# Patient Record
Sex: Female | Born: 1945 | Race: White | Hispanic: No | Smoking: Former smoker
Health system: Southern US, Community
[De-identification: ages and names within clinical notes are randomized; demographics above are authoritative.]

## PROBLEM LIST (undated history)

## (undated) DIAGNOSIS — D709 Neutropenia, unspecified: Secondary | ICD-10-CM

## (undated) DIAGNOSIS — M1990 Unspecified osteoarthritis, unspecified site: Secondary | ICD-10-CM

## (undated) DIAGNOSIS — E785 Hyperlipidemia, unspecified: Secondary | ICD-10-CM

## (undated) DIAGNOSIS — I10 Essential (primary) hypertension: Secondary | ICD-10-CM

## (undated) DIAGNOSIS — J449 Chronic obstructive pulmonary disease, unspecified: Secondary | ICD-10-CM

## (undated) DIAGNOSIS — C801 Malignant (primary) neoplasm, unspecified: Secondary | ICD-10-CM

## (undated) DIAGNOSIS — IMO0002 Reserved for concepts with insufficient information to code with codable children: Secondary | ICD-10-CM

## (undated) DIAGNOSIS — Z923 Personal history of irradiation: Secondary | ICD-10-CM

## (undated) DIAGNOSIS — M069 Rheumatoid arthritis, unspecified: Secondary | ICD-10-CM

## (undated) DIAGNOSIS — IMO0001 Reserved for inherently not codable concepts without codable children: Secondary | ICD-10-CM

## (undated) DIAGNOSIS — C4490 Unspecified malignant neoplasm of skin, unspecified: Secondary | ICD-10-CM

## (undated) DIAGNOSIS — I2510 Atherosclerotic heart disease of native coronary artery without angina pectoris: Secondary | ICD-10-CM

## (undated) DIAGNOSIS — Z720 Tobacco use: Secondary | ICD-10-CM

## (undated) HISTORY — DX: Malignant (primary) neoplasm, unspecified: C80.1

## (undated) HISTORY — DX: Personal history of irradiation: Z92.3

## (undated) HISTORY — DX: Atherosclerotic heart disease of native coronary artery without angina pectoris: I25.10

## (undated) HISTORY — DX: Tobacco use: Z72.0

## (undated) HISTORY — PX: SKIN CANCER EXCISION: SHX779

## (undated) HISTORY — DX: Hyperlipidemia, unspecified: E78.5

## (undated) HISTORY — PX: CATARACT EXTRACTION: SUR2

## (undated) HISTORY — DX: Neutropenia, unspecified: D70.9

## (undated) HISTORY — DX: Rheumatoid arthritis, unspecified: M06.9

## (undated) HISTORY — DX: Unspecified osteoarthritis, unspecified site: M19.90

## (undated) HISTORY — DX: Chronic obstructive pulmonary disease, unspecified: J44.9

## (undated) HISTORY — DX: Essential (primary) hypertension: I10

## (undated) HISTORY — PX: CARDIAC CATHETERIZATION: SHX172

---

## 2011-12-14 ENCOUNTER — Emergency Department (HOSPITAL_COMMUNITY)
Admission: EM | Admit: 2011-12-14 | Discharge: 2011-12-14 | Disposition: A | Discharge status: Home / Self Care | Payer: PRIVATE HEALTH INSURANCE | Attending: Emergency Medicine | Admitting: Emergency Medicine

## 2011-12-14 ENCOUNTER — Emergency Department (HOSPITAL_COMMUNITY): Payer: PRIVATE HEALTH INSURANCE

## 2011-12-14 ENCOUNTER — Encounter (HOSPITAL_COMMUNITY): Payer: Self-pay | Admitting: Emergency Medicine

## 2011-12-14 ENCOUNTER — Other Ambulatory Visit: Payer: Self-pay

## 2011-12-14 DIAGNOSIS — D72819 Decreased white blood cell count, unspecified: Secondary | ICD-10-CM

## 2011-12-14 DIAGNOSIS — R42 Dizziness and giddiness: Secondary | ICD-10-CM | POA: Insufficient documentation

## 2011-12-14 DIAGNOSIS — E7800 Pure hypercholesterolemia, unspecified: Secondary | ICD-10-CM | POA: Insufficient documentation

## 2011-12-14 DIAGNOSIS — F17200 Nicotine dependence, unspecified, uncomplicated: Secondary | ICD-10-CM | POA: Insufficient documentation

## 2011-12-14 DIAGNOSIS — I10 Essential (primary) hypertension: Secondary | ICD-10-CM | POA: Insufficient documentation

## 2011-12-14 DIAGNOSIS — Z79899 Other long term (current) drug therapy: Secondary | ICD-10-CM | POA: Insufficient documentation

## 2011-12-14 DIAGNOSIS — Z7982 Long term (current) use of aspirin: Secondary | ICD-10-CM | POA: Insufficient documentation

## 2011-12-14 LAB — CBC WITH DIFFERENTIAL/PLATELET
Basophils Absolute: 0 10*3/uL (ref 0.0–0.1)
Eosinophils Absolute: 0 10*3/uL (ref 0.0–0.7)
Lymphs Abs: 0.9 10*3/uL (ref 0.7–4.0)
MCHC: 33.4 g/dL (ref 30.0–36.0)
MCV: 86.1 fL (ref 78.0–100.0)
Monocytes Relative: 20 % — ABNORMAL HIGH (ref 3–12)
Platelets: 160 10*3/uL (ref 150–400)
RDW: 14.1 % (ref 11.5–15.5)
WBC: 1.2 10*3/uL — CL (ref 4.0–10.5)

## 2011-12-14 LAB — COMPREHENSIVE METABOLIC PANEL
AST: 18 U/L (ref 0–37)
Albumin: 3.6 g/dL (ref 3.5–5.2)
BUN: 13 mg/dL (ref 6–23)
Calcium: 8.9 mg/dL (ref 8.4–10.5)
Chloride: 102 mEq/L (ref 96–112)
Creatinine, Ser: 0.73 mg/dL (ref 0.50–1.10)
Total Bilirubin: 0.3 mg/dL (ref 0.3–1.2)
Total Protein: 7.7 g/dL (ref 6.0–8.3)

## 2011-12-14 LAB — URINALYSIS, ROUTINE W REFLEX MICROSCOPIC
Hgb urine dipstick: NEGATIVE
Ketones, ur: NEGATIVE mg/dL
Leukocytes, UA: NEGATIVE
Protein, ur: NEGATIVE mg/dL
Urobilinogen, UA: 1 mg/dL (ref 0.0–1.0)

## 2011-12-14 MED ORDER — ALBUTEROL SULFATE HFA 108 (90 BASE) MCG/ACT IN AERS: 2.0000 | INHALATION_SPRAY | RESPIRATORY_TRACT | Status: DC | PRN

## 2011-12-14 NOTE — ED Notes (Signed)
I gave the patient a warm blanket. 

## 2011-12-14 NOTE — ED Notes (Signed)
I gave the patients visitor a cup of coffee.

## 2011-12-14 NOTE — ED Notes (Signed)
Lab called with critical Low WBC = 1.2. Preston Fleeting MD notified.

## 2011-12-14 NOTE — ED Notes (Addendum)
ECG was performed in triage.

## 2011-12-14 NOTE — ED Notes (Signed)
Pt c/o dizziness x several months that became worse yesterday; pt sts worse with position change; pt sts HA yesterday; pt alert at present; pt denies weakness

## 2011-12-14 NOTE — ED Provider Notes (Signed)
History     CSN: [id number]  Arrival date & time 12/14/11  1506   First MD Initiated Contact with Patient 12/14/11 1635      Chief Complaint  Patient presents with  . Dizziness    (Consider location/radiation/quality/duration/timing/severity/associated sxs/prior treatment) The history is provided by the patient.   66 year old female has been having dizzy episodes for the last several weeks. They are stable neither getting worse and her better. She gets dizzy when she stands up but is fine when she is sitting down or laying down. She feels lightheaded and like she is going to pass out and off balance. She denies vertigo. She has not actually fallen. She has been seen by her PCP and in the emergency department at her local hospital and was found to have low blood count and was advised that she should come here. She has had fevers as high as 102, and has had some sweats. She denies chills. She denies cough, arthralgias, myalgias. Last night she vomited but she does not have any nausea now. Her appetite has been diminished but she does nothing she has lost any weight.  Past Medical History  Diagnosis Date  . Hypertension   . Hypercholesteremia     History reviewed. No pertinent past surgical history.  History reviewed. No pertinent family history.  History  Substance Use Topics  . Smoking status: Current Everyday Smoker  . Smokeless tobacco: Not on file  . Alcohol Use: No    OB History    Grav Para Term Preterm Abortions TAB SAB Ect Mult Living                  Review of Systems  All other systems reviewed and are negative.    Allergies  Codeine and Morphine and related  Home Medications   Current Outpatient Rx  Name Route Sig Dispense Refill  . ASPIRIN 325 MG PO TABS Oral Take 325-650 mg by mouth daily as needed. For pain    . CLONAZEPAM 1 MG PO TABS Oral Take 1 mg by mouth at bedtime.    [name] LISINOPRIL 20 MG PO TABS Oral Take 20 mg by mouth daily.    [name] PRAVASTATIN  SODIUM 40 MG PO TABS Oral Take 40 mg by mouth at bedtime.      BP 96/47  Pulse 68  Temp 98.1 F (36.7 C) (Oral)  Resp 16  SpO2 99%  Physical Exam  Nursing note and vitals reviewed.  66 year old female who is resting comfortably and in no acute distress. Vital signs are normal. Oxygen saturation is 99% which is normal. Head is normocephalic and atraumatic. PERRLA, EOMI. Neck is nontender and supple without adenopathy, JVD, or bruit. There is a transmitted murmur heard in the carotids. Back is nontender. Lungs are clear without rales, wheezes, or rhonchi. Heart has regular rate and rhythm with a 1-2/6 systolic ejection murmur heard along the left sternal border and cardiac base. Abdomen is soft, flat, nontender without masses or hepatosplenomegaly. Extremities have no cyanosis or edema, full range of motion is present. Skin is warm and dry without rash. Neurologic: Mental status is normal, cranial nerves are intact, there are no motor or sensory deficits. On Romberg testing, she is somewhat unsteady but does not consistently fall in any one direction.  ED Course  Procedures (including critical care time)  Results for orders placed during the hospital encounter of 12/14/11  URINALYSIS, ROUTINE W REFLEX MICROSCOPIC      Component Value Range  Color, Urine YELLOW  YELLOW   APPearance CLEAR  CLEAR   Specific Gravity, Urine 1.009  1.005 - 1.030   pH 6.0  5.0 - 8.0   Glucose, UA NEGATIVE  NEGATIVE mg/dL   Hgb urine dipstick NEGATIVE  NEGATIVE   Bilirubin Urine NEGATIVE  NEGATIVE   Ketones, ur NEGATIVE  NEGATIVE mg/dL   Protein, ur NEGATIVE  NEGATIVE mg/dL   Urobilinogen, UA 1.0  0.0 - 1.0 mg/dL   Nitrite NEGATIVE  NEGATIVE   Leukocytes, UA NEGATIVE  NEGATIVE  CBC WITH DIFFERENTIAL      Component Value Range   WBC 1.2 (*) 4.0 - 10.5 K/uL   RBC 3.75 (*) 3.87 - 5.11 MIL/uL   Hemoglobin 10.8 (*) 12.0 - 15.0 g/dL   HCT 16.1 (*) 09.6 - 04.5 %   MCV 86.1  78.0 - 100.0 fL   MCH 28.8  26.0 -  34.0 pg   MCHC 33.4  30.0 - 36.0 g/dL   RDW 40.9  81.1 - 91.4 %   Platelets 160  150 - 400 K/uL   Neutrophils Relative 6 (*) 43 - 77 %   Lymphocytes Relative 74 (*) 12 - 46 %   Monocytes Relative 20 (*) 3 - 12 %   Eosinophils Relative 0  0 - 5 %   Basophils Relative 0  0 - 1 %   Neutro Abs 0.1 (*) 1.7 - 7.7 K/uL   Lymphs Abs 0.9  0.7 - 4.0 K/uL   Monocytes Absolute 0.2  0.1 - 1.0 K/uL   Eosinophils Absolute 0.0  0.0 - 0.7 K/uL   Basophils Absolute 0.0  0.0 - 0.1 K/uL   Smear Review MORPHOLOGY UNREMARKABLE    COMPREHENSIVE METABOLIC PANEL      Component Value Range   Sodium 135  135 - 145 mEq/L   Potassium 4.3  3.5 - 5.1 mEq/L   Chloride 102  96 - 112 mEq/L   CO2 24  19 - 32 mEq/L   Glucose, Bld 106 (*) 70 - 99 mg/dL   BUN 13  6 - 23 mg/dL   Creatinine, Ser 7.82  0.50 - 1.10 mg/dL   Calcium 8.9  8.4 - 95.6 mg/dL   Total Protein 7.7  6.0 - 8.3 g/dL   Albumin 3.6  3.5 - 5.2 g/dL   AST 18  0 - 37 U/L   ALT 7  0 - 35 U/L   Alkaline Phosphatase 70  39 - 117 U/L   Total Bilirubin 0.3  0.3 - 1.2 mg/dL   GFR calc non Af Amer 86 (*) >90 mL/min   GFR calc Af Amer >90  >90 mL/min   Dg Chest 2 View  12/14/2011  *RADIOLOGY REPORT*  Clinical Data: Dizziness and weakness  CHEST - 2 VIEW  Comparison: None.  Findings: There are two nodules in the inferior right upper lobe. Left apical nodular densities are noted.  Interstitial prominence. Hyperaeration.  Bronchitic changes.  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  IMPRESSION: Bilateral pulmonary nodules.  CT can be performed if no prior studies are available.  Changes related to COPD.  Interstitial prominence.  This may reflect chronic disease however interstitial edema is not excluded.  Prior studies would be helpful for comparison.  Original Report Authenticated By: Donavan Burnet, M.D.      Date: 12/14/2011  Rate: 72  Rhythm: normal sinus rhythm  QRS Axis: right  Intervals: normal  ST/T Wave abnormalities: normal  Conduction  Disutrbances:none  Narrative Interpretation: Borderline right axis deviation, otherwise normal ECG. No old ECG available for comparison.  Old EKG Reviewed: none available    1. Leukopenia       MDM  Orthostatic dizziness of uncertain cause. Patient came with office notes and ED notes which showed that her WBC was 1.8 with hemoglobin of 11 and normal platelet count. Apparently, and WBC has been as low as 0.7. She had an adequate absolute neutrophil count. Because of leukopenia is unclear. With normal platelet count and borderline significant anemia, I doubt that this is actually an acute leukemia. Leukopenia is a known but relatively rare side effect of clonazepam, lisinopril, and pravastatin which are medications which the patient is taking. She will need a bone marrow aspirate and biopsy for more definitive diagnosis.  Case is discussed with Dr. Welton Flakes of the oncology service. She is not febrile today although absolute neutrophil count is extremely low being at about 65. She will need to be admitted if she does become febrile, but otherwise workup can be done as an outpatient. She's advised to discontinue all of her current medications since all of them have at least case reports of causing leukopenia. Dr. Welton Flakes will set up an appointment in the oncology clinic for outpatient evaluation.     Dione Booze, MD 12/14/11 [phone]

## 2011-12-14 NOTE — ED Notes (Signed)
Updated pt on wait time and care. Pt resting with family member at side. Told to let RN know if anything changes

## 2011-12-15 LAB — PATHOLOGIST SMEAR REVIEW

## 2011-12-20 ENCOUNTER — Telehealth: Payer: Self-pay | Admitting: Oncology

## 2011-12-20 NOTE — Telephone Encounter (Signed)
S/w the pt's caregiver and they have declined the appt her ewith [place] and has na appt set up wityh an md in [place] which is closer to their home

## 2011-12-27 ENCOUNTER — Other Ambulatory Visit (HOSPITAL_COMMUNITY)
Admission: RE | Admit: 2011-12-27 | Discharge: 2011-12-27 | Disposition: A | Discharge status: Home / Self Care | Payer: PRIVATE HEALTH INSURANCE | Source: Ambulatory Visit | Attending: Oncology | Admitting: Oncology

## 2011-12-27 DIAGNOSIS — D709 Neutropenia, unspecified: Secondary | ICD-10-CM | POA: Insufficient documentation

## 2011-12-27 DIAGNOSIS — D649 Anemia, unspecified: Secondary | ICD-10-CM | POA: Insufficient documentation

## 2012-01-05 ENCOUNTER — Ambulatory Visit: Payer: Medicare Other | Admitting: Oncology

## 2012-01-05 ENCOUNTER — Other Ambulatory Visit: Payer: Medicare Other | Admitting: Lab

## 2012-01-05 ENCOUNTER — Ambulatory Visit: Payer: Medicare Other

## 2012-09-12 DIAGNOSIS — C801 Malignant (primary) neoplasm, unspecified: Secondary | ICD-10-CM

## 2012-09-12 HISTORY — PX: LUNG BIOPSY: SHX232

## 2012-09-12 HISTORY — DX: Malignant (primary) neoplasm, unspecified: C80.1

## 2012-12-24 ENCOUNTER — Encounter: Payer: Medicare Other | Admitting: Cardiothoracic Surgery

## 2012-12-25 ENCOUNTER — Encounter: Payer: Self-pay | Admitting: *Deleted

## 2012-12-25 ENCOUNTER — Other Ambulatory Visit: Payer: Self-pay | Admitting: *Deleted

## 2012-12-25 DIAGNOSIS — D709 Neutropenia, unspecified: Secondary | ICD-10-CM | POA: Insufficient documentation

## 2012-12-25 DIAGNOSIS — C3490 Malignant neoplasm of unspecified part of unspecified bronchus or lung: Secondary | ICD-10-CM | POA: Insufficient documentation

## 2012-12-26 ENCOUNTER — Institutional Professional Consult (permissible substitution) (INDEPENDENT_AMBULATORY_CARE_PROVIDER_SITE_OTHER): Payer: PRIVATE HEALTH INSURANCE | Admitting: Cardiothoracic Surgery

## 2012-12-26 ENCOUNTER — Encounter: Payer: Medicare Other | Admitting: Cardiothoracic Surgery

## 2012-12-26 ENCOUNTER — Other Ambulatory Visit: Payer: Self-pay | Admitting: *Deleted

## 2012-12-26 VITALS — BP 103/63 | HR 81 | Resp 16 | Ht 68.0 in | Wt 135.0 lb

## 2012-12-26 DIAGNOSIS — M069 Rheumatoid arthritis, unspecified: Secondary | ICD-10-CM | POA: Insufficient documentation

## 2012-12-26 DIAGNOSIS — R42 Dizziness and giddiness: Secondary | ICD-10-CM

## 2012-12-26 DIAGNOSIS — J984 Other disorders of lung: Secondary | ICD-10-CM

## 2012-12-26 DIAGNOSIS — R911 Solitary pulmonary nodule: Secondary | ICD-10-CM

## 2012-12-26 DIAGNOSIS — F17200 Nicotine dependence, unspecified, uncomplicated: Secondary | ICD-10-CM

## 2012-12-26 DIAGNOSIS — B029 Zoster without complications: Secondary | ICD-10-CM | POA: Insufficient documentation

## 2012-12-26 DIAGNOSIS — J449 Chronic obstructive pulmonary disease, unspecified: Secondary | ICD-10-CM | POA: Insufficient documentation

## 2012-12-26 DIAGNOSIS — C3490 Malignant neoplasm of unspecified part of unspecified bronchus or lung: Secondary | ICD-10-CM

## 2012-12-26 DIAGNOSIS — Z720 Tobacco use: Secondary | ICD-10-CM | POA: Insufficient documentation

## 2012-12-26 LAB — CREATININE, SERUM: Creat: 1.01 mg/dL (ref 0.50–1.10)

## 2012-12-26 LAB — BUN: BUN: 11 mg/dL (ref 6–23)

## 2012-12-26 NOTE — Progress Notes (Signed)
PCP is Ailene Ravel, MD Referring Provider is Weston Settle, MD  Chief Complaint  Patient presents with  . 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 She was initially evaluated by Dr. Lissa Hoard for a 2 cm right lower lobe mass and a 1 cm right upper lobe mass. He's had fairly low activity and PET scan in repeat scans this spring showed increase in activity of the right  lower lobe mass at 9.6 SUV. The right upper lobe mass had a SUV of 4.3 slightly less than the initial scan. Patient underwent a needle biopsy of the right lower lobe mass which showed squamous cell carcinoma.. 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  PET scan showed no suspicious mediastinal or other metastatic sites. She has not had a head scan.  The patient still smokes at least half a pack a day and has active productive cough. PFTs show FEV1 of 1.8 and FVC 2.5 with diffusion capacity of 44%. A 3 minute walk test in the office dropped oxygen saturation from 97% at rest to 95% after exercise.  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 Past Medical History  Diagnosis Date  . Hypertension   . Hypercholesteremia   . Cancer 09/12/12    RIGHT LOWER LOBE    Past Surgical History  Procedure Laterality Date  . Lung biopsy  09/12/12    [facility]    No family history on file.  Social History History  Substance Use Topics  . Smoking status: Current Every Day Smoker  . Smokeless tobacco: Not on file  . Alcohol Use: No    Current  Outpatient Prescriptions  Medication Sig Dispense Refill  . diphenhydrAMINE (SOMINEX) 25 MG tablet Take 50 mg by mouth at bedtime as needed for sleep.      [name] losartan (COZAAR) 50 MG tablet Take 50 mg by mouth daily.      . pravastatin (PRAVACHOL) 40 MG tablet Take 40 mg by mouth at bedtime.      [name] albuterol (PROVENTIL HFA;VENTOLIN HFA) 108 (90 BASE) MCG/ACT inhaler Inhale 2 puffs into the lungs every 4 (four) hours as needed for wheezing or shortness of breath (or coughing).  1 Inhaler  0  . aspirin 325 MG tablet Take 325-650 mg by mouth daily as needed. For pain      . [DISCONTINUED] clonazePAM (KLONOPIN) 1 MG tablet Take 1 mg by mouth at bedtime.      . [DISCONTINUED] lisinopril (PRINIVIL,ZESTRIL) 20 MG tablet Take 20 mg by mouth daily.       No current facility-administered medications for this visit.    Allergies  Allergen Reactions  . Codeine Other (See Comments)    Passes out  . Morphine And Related Other (See Comments)    Patient assumes she is allergic to morphine because she is allergic to codeine    Review of Systems Gen.-Weight stable actually increased after prednisone taper no night sweats no headaches no bone pain except for arthritic knees HEENT-edentulous some difficulty swallowing solid> liquid with coughing Thorax small pneumothorax after needle biopsy of right lung  no history of significant thoracic trauma otherwise Cardiac no history of angina no family history of MI positive risk factors of hypertension smoking and hyperlipidemia    No previous cardiac evaluation GI no ulcer jaundice hepatitis colitis Urologic no history of kidney stones or hematuria Endocrine no history diabetes                                             vascular no DVT claudication or TIA Neurologic no falls no strokes head CT scan pending Hematologic chronic low white count runs between 1.3k  and normal range  BP 103/63  Pulse 81  Resp 16  Ht 5\' 8"  (1.727 m)  Wt 135 lb (61.236 kg)  BMI  20.53 kg/m2  SpO2 97% Physical Exam Gen. fragile elderly female with severe ecchymotic areas over her forearms appears older than stated age HEENT normocephalic edentulous pupils equal Neck without mass JVD adenopathy Thorax scattered rhonchi and wheezes Cardiac regular and without murmur or gallop Abdomen scaphoid soft nontender without pulsatile mass Extremities mild clubbing mild deformities from arthritis of the hands no significant edema Vascular nonpalpable pedal pulses extremities however warm no DVT signs   Diagnostic Tests:  CT scan, PET scan, PFTs and pathology report all reviewed  Impression:  2 probable carcinomas in the right lung--right upper lobe not biopsy-proven and right lower lobe biopsy proven squamous cell carcinoma. 2 concurrent malignancies in the ipsilateral lung indicate the is stage III. However assessment of the anatomy indicates these both could be wedged out and would probably divide her best long-term option if she can stop smoking. She understands she'll not be eligible for surgery unless she completely stop smoking for 3   She will also need to have her severe herpes zoster with surrounding cellulitis resolved prior to surgery. She apparently has been treated with this with oral acyclovir and topical soap.  Plan:  Return for reassessment in 3 weeks with a head CT scan to complete clinical staging and to assess status of her smoking and status of the herpes zoster with surrounding cellulitis. Surgery will be scheduled for later date when her smoking and herpes zoster are. resolved

## 2012-12-30 ENCOUNTER — Other Ambulatory Visit: Payer: Self-pay | Admitting: *Deleted

## 2012-12-30 DIAGNOSIS — R911 Solitary pulmonary nodule: Secondary | ICD-10-CM

## 2013-01-01 ENCOUNTER — Other Ambulatory Visit: Payer: Self-pay | Admitting: *Deleted

## 2013-01-06 ENCOUNTER — Other Ambulatory Visit: Payer: Self-pay | Admitting: *Deleted

## 2013-01-06 DIAGNOSIS — R519 Headache, unspecified: Secondary | ICD-10-CM

## 2013-01-06 DIAGNOSIS — R911 Solitary pulmonary nodule: Secondary | ICD-10-CM

## 2013-01-07 ENCOUNTER — Other Ambulatory Visit: Payer: Self-pay | Admitting: *Deleted

## 2013-01-07 ENCOUNTER — Other Ambulatory Visit: Payer: PRIVATE HEALTH INSURANCE

## 2013-01-07 DIAGNOSIS — R911 Solitary pulmonary nodule: Secondary | ICD-10-CM

## 2013-01-08 ENCOUNTER — Other Ambulatory Visit: Payer: PRIVATE HEALTH INSURANCE

## 2013-01-08 ENCOUNTER — Ambulatory Visit: Payer: PRIVATE HEALTH INSURANCE | Admitting: Cardiothoracic Surgery

## 2013-01-22 ENCOUNTER — Encounter: Payer: Self-pay | Admitting: Cardiothoracic Surgery

## 2013-01-22 ENCOUNTER — Other Ambulatory Visit: Payer: PRIVATE HEALTH INSURANCE

## 2013-01-22 ENCOUNTER — Ambulatory Visit: Payer: PRIVATE HEALTH INSURANCE | Admitting: Cardiothoracic Surgery

## 2013-01-22 ENCOUNTER — Ambulatory Visit (INDEPENDENT_AMBULATORY_CARE_PROVIDER_SITE_OTHER): Payer: PRIVATE HEALTH INSURANCE | Admitting: Cardiothoracic Surgery

## 2013-01-22 VITALS — BP 166/88 | HR 86 | Resp 18 | Ht 68.0 in | Wt 131.0 lb

## 2013-01-22 DIAGNOSIS — C3490 Malignant neoplasm of unspecified part of unspecified bronchus or lung: Secondary | ICD-10-CM

## 2013-01-22 DIAGNOSIS — F17200 Nicotine dependence, unspecified, uncomplicated: Secondary | ICD-10-CM

## 2013-01-22 DIAGNOSIS — B029 Zoster without complications: Secondary | ICD-10-CM

## 2013-01-22 DIAGNOSIS — C3491 Malignant neoplasm of unspecified part of right bronchus or lung: Secondary | ICD-10-CM

## 2013-01-22 DIAGNOSIS — Z720 Tobacco use: Secondary | ICD-10-CM

## 2013-01-22 DIAGNOSIS — J984 Other disorders of lung: Secondary | ICD-10-CM

## 2013-01-22 DIAGNOSIS — R911 Solitary pulmonary nodule: Secondary | ICD-10-CM

## 2013-01-22 NOTE — Progress Notes (Signed)
PCP is Ailene Ravel, MD Referring Provider is Weston Settle, MD  Chief Complaint  Patient presents with  . Lung Lesion    3 week f/u S/P MRI Brain, last cigarette on 01/20/13    HPI: 67 year old female smoker with biopsy-proven squamous cell carcinoma basilar segment right lower lobe. She also has a small 1.8 cm right upper lobe density with mild PET activity. This has not been biopsied. No evidence of mediastinal or distant metastases by PET scan and brain MRI. She is still smoking but says she stopped yesterday.  Her herpes zoster has resolved She has significant coronary calcifications on her CT scan of chest from March 2014, history of hypertension smoking and positive family history of CAD. She'll need stress test-cardiology clearance prior to surgery--surgery will be offered only if she stops smoking for 3 weeks.   Past Medical History  Diagnosis Date  . Hypertension   . Hypercholesteremia   . Cancer 09/12/12    RIGHT LOWER LOBE    Past Surgical History  Procedure Laterality Date  . Lung biopsy  09/12/12    [facility]    No family history on file.  Social History History  Substance Use Topics  . Smoking status: Former Smoker    Start date: 01/20/2013  . Smokeless tobacco: Not on file  . Alcohol Use: No    Current Outpatient Prescriptions  Medication Sig Dispense Refill  . albuterol (PROVENTIL HFA;VENTOLIN HFA) 108 (90 BASE) MCG/ACT inhaler Inhale 2 puffs into the lungs every 4 (four) hours as needed for wheezing or shortness of breath (or coughing).  1 Inhaler  0  . aspirin 325 MG tablet Take 325-650 mg by mouth daily as needed. For pain      . clonazePAM (KLONOPIN) 1 MG tablet Take 1 mg by mouth 2 (two) times daily as needed for anxiety.      [name] losartan (COZAAR) 50 MG tablet Take 50 mg by mouth daily.      . pravastatin (PRAVACHOL) 40 MG tablet Take 40 mg by mouth at bedtime.      . [DISCONTINUED] lisinopril (PRINIVIL,ZESTRIL) 20 MG tablet Take 20 mg  by mouth daily.       No current facility-administered medications for this visit.    Allergies  Allergen Reactions  . Codeine Other (See Comments)    Passes out  . Morphine And Related Other (See Comments)    Patient assumes she is allergic to morphine because she is allergic to codeine    Review of Systems no weight change no hemoptysis Patient stays fairly active according to her daughter BP 166/88  Pulse 86  Resp 18  Ht 5\' 8"  (1.727 m)  Wt 131 lb (59.421 kg)  BMI 19.92 kg/m2  SpO2 97% Physical Exam Alert and responsive, able to walk easily down the hallway in the office Lungs scattered rhonchi Cardiac regular rhythm without gallop Neuro intact Skin with resolved the zoster lesions right flank and thigh   Diagnostic Tests:  Brain MRI results reviewed with patient and family Impression: 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 I wouldn't consider this fairly low risk for a second primary. PFTs are adequate for thoracotomy. However active smoking would place her in a significant risk for postoperative pulmonary problems and she will need to stop. We also need to get cardiology clearance prior to surgery.  Plan: Stop smoking and get  the cardiology clearance-stress test due to heavy calcifications on CT scan with positive multiple risk factors for CAD

## 2013-02-19 ENCOUNTER — Other Ambulatory Visit: Payer: Self-pay | Admitting: *Deleted

## 2013-02-19 ENCOUNTER — Ambulatory Visit: Payer: PRIVATE HEALTH INSURANCE | Admitting: Cardiothoracic Surgery

## 2013-03-06 ENCOUNTER — Ambulatory Visit (HOSPITAL_COMMUNITY): Payer: Medicare Other | Attending: Interventional Cardiology | Admitting: Radiology

## 2013-03-06 VITALS — BP 139/68 | HR 68 | Ht 68.0 in | Wt 128.0 lb

## 2013-03-06 DIAGNOSIS — Z01810 Encounter for preprocedural cardiovascular examination: Secondary | ICD-10-CM

## 2013-03-06 DIAGNOSIS — R0602 Shortness of breath: Secondary | ICD-10-CM

## 2013-03-06 MED ORDER — TECHNETIUM TC 99M SESTAMIBI GENERIC - CARDIOLITE
11.0000 | Freq: Once | INTRAVENOUS | Status: AC | PRN
  Administered 2013-03-06: 11 via INTRAVENOUS

## 2013-03-06 MED ORDER — TECHNETIUM TC 99M SESTAMIBI GENERIC - CARDIOLITE
32.8000 | Freq: Once | INTRAVENOUS | Status: AC | PRN
  Administered 2013-03-06: 32.8 via INTRAVENOUS

## 2013-03-06 MED ORDER — REGADENOSON 0.4 MG/5ML IV SOLN
0.4000 mg | Freq: Once | INTRAVENOUS | Status: AC
  Administered 2013-03-06: 0.4 mg via INTRAVENOUS

## 2013-03-06 NOTE — Progress Notes (Signed)
[facility] SITE 3 NUCLEAR MED [address] [place], [place] [address] [phone]    Cardiology Nuclear Med Study  Olivia Nichols is a 67 y.o. female     MRN : [id number]     DOB: 01-26-1946  Procedure Date: 03/06/2013  Nuclear Med Background Indication for Stress Test:  Evaluation for Ischemia and Pending Surgical Clearance Lung Surgery by Dr. Kathlee Nations Trigt History:  ~5 yrs ago MWU:[id number]; 3/14 NU:[id number] calcification. Cardiac Risk Factors: History of Smoking, Hypertension, Lipids and Smoker  Symptoms:  Dizziness, DOE, Nausea, Near Syncope and Palpitations   Nuclear Pre-Procedure Caffeine/Decaff Intake:  None NPO After: 11 am yesterday   Lungs:  Rhonchi with minimal inspiratory wheezes.  Albuterol used prior to [employer]. O2 Sat: 94% on room air. IV 0.9% NS with Angio Cath:  22g  IV Site: R Antecubital  IV Started by:  Bonnita Levan, RN  Chest Size (in):  36 Cup Size: B  Height: 5\' 8"  (1.727 m)  Weight:  128 lb (58.06 kg)  BMI:  Body mass index is 19.47 kg/(m^2). Tech Comments:  N/A    Nuclear Med Study 1 or 2 day study: 1 day  Stress Test Type:  Lexiscan  Reading MD: Verdis Prime, MD  Order Authorizing Provider:  Verdis Prime, MD  Resting Radionuclide: Technetium 69m Sestamibi  Resting Radionuclide Dose: 11.0 mCi   Stress Radionuclide:  Technetium 60m Sestamibi  Stress Radionuclide Dose: 33.0 mCi           Stress Protocol Rest HR: 68 Stress HR: 85  Rest BP: 139/68 Stress BP: 152/52  Exercise Time (min): n/a METS: n/a   Predicted Max HR: 151 bpm % Max HR: 56.29 bpm Rate Pressure Product: 40347   Dose of Adenosine (mg):  n/a Dose of Lexiscan: 0.4 mg  Dose of Atropine (mg): n/a Dose of Dobutamine: n/a mcg/kg/min (at max HR)  Stress Test Technologist: Smiley Houseman, CMA-N  Nuclear Technologist:  Domenic Polite, CNMT     Rest Procedure:  Myocardial perfusion imaging was performed at rest 45 minutes following the intravenous administration of Technetium  49m Sestamibi.  Rest ECG: NSR with old anterior MI  Stress Procedure:  The patient received IV Lexiscan 0.4 mg over 15-seconds.  Technetium 16m Sestamibi injected at 30-seconds.  She c/o feeling woozy with Lexiscan and there were occasional PVC's noted.  Quantitative spect images were obtained after a 45 minute delay.  Stress ECG: No significant change from baseline ECG  QPS Raw Data Images:  Very mild breast attenuation Stress Images:  There is decreased uptake in the anterior wall. Rest Images:  There is persistent but smaller anterior defect. Subtraction (SDS):  These findings are consistent with ischemia. Transient Ischemic Dilatation (Normal <1.22):  Present Lung/Heart Ratio (Normal <0.45):  0.48  Quantitative Gated Spect Images QGS EDV:  77 ml QGS ESV:  25 ml  Impression Exercise Capacity:  Lexiscan with no exercise. BP Response:  Normal blood pressure response. Clinical Symptoms:  There is dyspnea. ECG Impression:  No significant ST segment change suggestive of ischemia. Comparison with Prior Nuclear Study: No images to compare  Overall Impression:  High risk stress nuclear study due to ischemic dilatation and mid to distal anterior ischemia..  LV Ejection Fraction: 68%.  LV Wall Motion:  NL LV Function; NL Wall Motion

## 2013-03-07 ENCOUNTER — Telehealth: Payer: Self-pay

## 2013-03-07 ENCOUNTER — Encounter: Payer: Self-pay | Admitting: Interventional Cardiology

## 2013-03-07 NOTE — Telephone Encounter (Signed)
advised pt of appt with Dr.Smith 03/10/13 @9 :15 to discuss heart cath.pt confirmed appt and verbalized understanding

## 2013-03-10 ENCOUNTER — Telehealth: Payer: Self-pay

## 2013-03-10 ENCOUNTER — Ambulatory Visit: Payer: PRIVATE HEALTH INSURANCE | Admitting: Interventional Cardiology

## 2013-03-10 NOTE — Telephone Encounter (Signed)
lmom.pt to have possible cath on 03/13/13. pt has transportation issues and wanted to adv pt of possible cath date

## 2013-03-12 ENCOUNTER — Ambulatory Visit (INDEPENDENT_AMBULATORY_CARE_PROVIDER_SITE_OTHER): Payer: Medicare Other | Admitting: Interventional Cardiology

## 2013-03-12 ENCOUNTER — Encounter: Payer: Self-pay | Admitting: Interventional Cardiology

## 2013-03-12 ENCOUNTER — Telehealth: Payer: Self-pay

## 2013-03-12 ENCOUNTER — Telehealth: Payer: Self-pay | Admitting: Interventional Cardiology

## 2013-03-12 VITALS — BP 148/80 | HR 80 | Ht 68.0 in | Wt 128.8 lb

## 2013-03-12 DIAGNOSIS — R9430 Abnormal result of cardiovascular function study, unspecified: Secondary | ICD-10-CM

## 2013-03-12 DIAGNOSIS — I25810 Atherosclerosis of coronary artery bypass graft(s) without angina pectoris: Secondary | ICD-10-CM

## 2013-03-12 LAB — BASIC METABOLIC PANEL
Chloride: 98 mEq/L (ref 96–112)
GFR: 99.46 mL/min (ref >60.00)
Potassium: 4.1 mEq/L (ref 3.5–5.1)
Sodium: 132 mEq/L — ABNORMAL LOW (ref 135–145)

## 2013-03-12 LAB — CBC WITH DIFFERENTIAL/PLATELET
Basophils Relative: 0.5 % (ref 0.0–3.0)
Eosinophils Relative: 0.5 % (ref 0.0–5.0)
HCT: 35.7 % — ABNORMAL LOW (ref 36.0–46.0)
Lymphs Abs: 1.3 10*3/uL (ref 0.7–4.0)
MCV: 84.5 fl (ref 78.0–100.0)
Monocytes Absolute: 0.3 10*3/uL (ref 0.1–1.0)
Monocytes Relative: 18.7 % — ABNORMAL HIGH (ref 3.0–12.0)
Platelets: 304 10*3/uL (ref 150.0–400.0)
RBC: 4.22 Mil/uL (ref 3.87–5.11)
WBC: 1.8 10*3/uL — CL (ref 4.5–10.5)

## 2013-03-12 NOTE — Telephone Encounter (Signed)
Follow Up:  Victorino Dike, pt's daughter, states she is returning Lisa's call.... Regarding Pt's CBC level

## 2013-03-12 NOTE — Telephone Encounter (Signed)
pt wbc is low at 1.8..Dr.Smith with [facility] cath scheduled for 03/13/13 until clearance is given by pt oncoloist Dr.Lewis in [place].called Dr.Lewis office, he is off today..left a message with the triage nurse.pt dtr notified of low wbc and sts "thats normal" for the pt. Adv pt cath would be cancelled and we will f/u with Dr.Lewis and be in touch with her

## 2013-03-12 NOTE — Progress Notes (Signed)
Patient ID: Olivia Nichols, female   DOB: 14-Aug-1945, 67 y.o.   MRN: [id number] Progress Notes     Patient: Olivia Nichols, Olivia Nichols Account Number: [id number] Provider: Verdis Prime, MD  DOB: [birth year] Age: 67 Y Sex: Female Date: 03/04/2013  Phone: [phone]   Address: [address] [place], [id number]  Pcp: Sharman Crate Hamrick          1. Referreed by Dr Morton Peters for surgical clearance for lung cancer.        HPI:  General:  The patient is a 67 and has a recent diagnosis of squamous cell carcinoma of the right lower lobe. CT angiography demonstrated significant coronary calcification in March of 2014. She has no history compatible with angina pectoris but there is a history of significant risk factors including hypertension, smoking, and family history. She has been referred to do clearance for upcoming lung.        ROS:  CARDIOLOGY:  no Chest tightness. no Claudication. no Cyanosis. no Dyspnea on exertion. no Edema. no Fatigue. no Irregular heart beat. no Murmurs. no Near Syncope. no Orthopnea. no Palpitations. no PND (paroxsymal nocturnal dyspnea). Signs of GI bleeding None. Snoring and/or insomnia None. Syncope no. Transient neurological symptoms None. Visual changes none.  RESPIRATORY:  Patient denies DOE (dyspnea on exertion), cough, blood-tinged sputum, hemoptysis, pain with breathing , wheezing.  NEUROLOGY:  Patient denies headache, insomnia, confusion, gait abnormality, paralysis, paresthesias, seizures, transient neurologiacal deficits..  PSYCHOLOGY:  Patient complaining of High anxiety about proposed upcoming surgery.         Medical History: .Hypertension, Hyperlipidemia, Right lower lobe PNA, COPD, Osteoarthritis.        Surgical History: Lung Biopsy 2014.        Family History: Father: deceased, diagnosed with CVA, CADMother: deceased, pvd, diagnosed with CADBrother 1: deceased, copdBrother2: alive, diagnosed with CADBrother 3: aliveSister 1: deceased       Social History:  General:  Tobacco use  cigarettes: Current smoker Frequency: 1.5 PPD Estimated Pack-years: 45 Tobacco history last updated 03/04/2013 no Alcohol.  no Caffeine.  no Exercise.  Occupation: unemployed.  no Education.  Marital Status: single, widowed.        Medications: Taking Albuterol Sulfate HFA 108 (90 Base) MCG/ACT Aerosol Solution 2 puffs as needed every 4 hrs, Taking Aspirin 325 mg 325 mg tablet one to two tablets daily, Taking Clonazepam 1 MG Tablet 1 tablet Twice a day, Notes: as needed, Taking Losartan Potassium 50 MG Tablet 1 tablet Once a day, Taking Pravastatin Sodium 40 MG Tablet 1 tablet Once a day, Medication List reviewed and reconciled with the patient       Allergies: Codeine (for allergy), Morphine Sulfate.           Vitals: Wt 127.6, Ht states 68, Pulse sitting 80, BP sitting 140/70.       Examination:  Cardiology Exam:  GENERAL APPEARANCE: pleasant, NAD, comfortable, frail appearing and looking older than her stated age.[name]  HEENT: normal.  CAROTID UPSTROKE: no bruit, upstrokes intact.  JVD: flat.  HEART: regular rate and rhythm, normal S1S2, no rub, no gallop, or click.  HEART MURMUR: none.  LUNGS: clear to auscultation, no wheezing/rhonchi/rales.  ABDOMEN: soft, non-tender, no hepatomegaly, no masses palpated.  EXTREMITIES: no leg edema.  PERIPHERAL PULSES: 2+, bilateral.  NEUROLOGIC: grossly intact, cranial nerves intact, gait WNL.  MOOD: normal.            Assessment:  1. Coronary atherosclerosis of native coronary artery - 414.01 (Primary), calcification in  coronary arteirs on CT scan.  2. Lung cancer - 162.9  3. COPD (chronic obstructive pulmonary disease) - 496  4. Hypertension, benign - 401.1  5. Hyperlipidemia - 272.4        1. Coronary atherosclerosis of native coronary artery  Continue Aspirin 325 mg tablet, 325 mg, one to two tablets, orally, daily .  Imaging: EKG Normal     Olivia Nichols,Olivia Nichols 03/04/2013 01:49:03 PM >  Olivia Nichols,Olivia Nichols 03/04/2013 02:18:33 PM >   Imaging: EC Stress Test [employer] (Ordered for 03/04/2013) Notes: Stress or pharmacologic cardiolite.       2. COPD (chronic obstructive pulmonary disease)  Continue Albuterol Sulfate HFA Aerosol Solution, 108 (90 Base) MCG/ACT, 2 puffs as needed, Inhalation, every 4 hrs .       3. Hypertension, benign  Continue Losartan Potassium Tablet, 50 MG, 1 tablet, Orally, Once a day .       4. Hyperlipidemia  Continue Pravastatin Sodium Tablet, 40 MG, 1 tablet, Orally, Once a day .       5. Others  Continue Clonazepam Tablet, 1 MG, 1 tablet, Orally, Twice a day, Notes: as needed ; Continue Albuterol Sulfate HFA Aerosol Solution, 108 (90 Base) MCG/ACT, 2 puffs as needed, Inhalation, every 4 hrs ; Continue Aspirin 325 mg tablet, 325 mg, one to two tablets, orally, daily .        Procedure Codes: 16109 EKG I AND R       Follow Up: prn         Provider: Verdis Prime, MD  Patient: Olivia Nichols, Olivia Nichols DOB: 02-16-1946 Date: 03/04/2013      ADDENDUM: 03/12/13  The patient underwent the above stated nuclear study, which demonstrated distal anterior wall ischemia and ischemic dilatation. The study was felt to represent a intermediate risk study. Prior to pulmonary resection, I feel that coronary angiography to define anatomy is appropriate. I discussed the indications for catheterization with the patient and family. In retrospect, the family now states that the patient intermittently complains of chest pain.  Coronary angiography via the radial approach was discussed in detail. The risks, including stroke, death, myocardial infarction, bleeding, kidney failure, limb ischemia, allergy, among others were discussed in detail and except. We'll plan to proceed with the procedure on 03/13/2013.

## 2013-03-12 NOTE — Progress Notes (Signed)
Patient ID: Olivia Nichols, female   DOB: 09/04/1945, 67 y.o.   MRN: [id number]  ECG demonstrated normal sinus rhythm, left atrial abnormality, but otherwise normal. 

## 2013-03-12 NOTE — Telephone Encounter (Signed)
spoke with pt dtr. adv her Dr.Smith will talk with Dr. Melvyn Neth before proceeding with Cath. we will call pt to reschedule cath.

## 2013-03-12 NOTE — Patient Instructions (Signed)
Your physician has requested that you have a cardiac catheterization. Cardiac catheterization is used to diagnose and/or treat various heart conditions. Doctors may recommend this procedure for a number of different reasons. The most common reason is to evaluate chest pain. Chest pain can be a symptom of coronary artery disease (CAD), and cardiac catheterization can show whether plaque is narrowing or blocking your heart's arteries. This procedure is also used to evaluate the valves, as well as measure the blood flow and oxygen levels in different parts of your heart. For further information please visit [email or web address] Please follow instruction sheet, as given.  YOUR CATH IS SCHEDULED FOR 03/13/13@10 :30 AM PLEASE ARRIVE AT THE [facility] AT 8:30AM

## 2013-03-13 ENCOUNTER — Encounter (HOSPITAL_COMMUNITY): Admission: RE | Payer: Self-pay | Source: Ambulatory Visit

## 2013-03-13 ENCOUNTER — Ambulatory Visit (HOSPITAL_COMMUNITY)
Admission: RE | Admit: 2013-03-13 | Payer: Medicare Other | Source: Ambulatory Visit | Admitting: Interventional Cardiology

## 2013-03-13 SURGERY — LEFT HEART CATHETERIZATION WITH CORONARY ANGIOGRAM
Anesthesia: LOCAL

## 2013-03-14 ENCOUNTER — Telehealth: Payer: Self-pay

## 2013-03-14 NOTE — Telephone Encounter (Signed)
pt heart catherization resscheduled for 03/21/13 @ 9am with Dr.Smith. pt dtr adv to follow instructions previously given. pt dtr notified of new date and to report 2 hrs before scheduled time.pt dtr verbalized understanding

## 2013-03-19 ENCOUNTER — Ambulatory Visit: Payer: Medicare Other | Admitting: Cardiothoracic Surgery

## 2013-03-20 ENCOUNTER — Other Ambulatory Visit: Payer: Self-pay | Admitting: Interventional Cardiology

## 2013-03-20 DIAGNOSIS — I2510 Atherosclerotic heart disease of native coronary artery without angina pectoris: Secondary | ICD-10-CM

## 2013-03-21 ENCOUNTER — Ambulatory Visit (HOSPITAL_COMMUNITY)
Admission: RE | Admit: 2013-03-21 | Discharge: 2013-03-21 | Disposition: A | Discharge status: Home / Self Care | Payer: Medicare Other | Source: Ambulatory Visit | Attending: Interventional Cardiology | Admitting: Interventional Cardiology

## 2013-03-21 ENCOUNTER — Encounter (HOSPITAL_COMMUNITY)
Admission: RE | Disposition: A | Discharge status: Home / Self Care | Payer: Self-pay | Source: Ambulatory Visit | Attending: Interventional Cardiology

## 2013-03-21 DIAGNOSIS — R9439 Abnormal result of other cardiovascular function study: Secondary | ICD-10-CM | POA: Diagnosis present

## 2013-03-21 DIAGNOSIS — R911 Solitary pulmonary nodule: Secondary | ICD-10-CM | POA: Insufficient documentation

## 2013-03-21 DIAGNOSIS — I2510 Atherosclerotic heart disease of native coronary artery without angina pectoris: Secondary | ICD-10-CM

## 2013-03-21 DIAGNOSIS — D72819 Decreased white blood cell count, unspecified: Secondary | ICD-10-CM | POA: Insufficient documentation

## 2013-03-21 HISTORY — PX: LEFT HEART CATHETERIZATION WITH CORONARY ANGIOGRAM: SHX5451

## 2013-03-21 LAB — CBC
HCT: 33.6 % — ABNORMAL LOW (ref 36.0–46.0)
Hemoglobin: 11.1 g/dL — ABNORMAL LOW (ref 12.0–15.0)
MCH: 28.2 pg (ref 26.0–34.0)
MCHC: 33 g/dL (ref 30.0–36.0)
MCV: 85.5 fL (ref 78.0–100.0)
Platelets: 269 10*3/uL (ref 150–400)
RBC: 3.93 MIL/uL (ref 3.87–5.11)
RDW: 14.6 % (ref 11.5–15.5)
WBC: 1.8 10*3/uL — ABNORMAL LOW (ref 4.0–10.5)

## 2013-03-21 LAB — PROTIME-INR: Prothrombin Time: 11.8 seconds (ref 11.6–15.2)

## 2013-03-21 SURGERY — LEFT HEART CATHETERIZATION WITH CORONARY ANGIOGRAM
Anesthesia: LOCAL

## 2013-03-21 MED ORDER — SODIUM CHLORIDE 0.9 % IV SOLN: INTRAVENOUS | Status: DC

## 2013-03-21 MED ORDER — HEPARIN (PORCINE) IN NACL 2-0.9 UNIT/ML-% IJ SOLN
INTRAMUSCULAR | Status: AC
  Filled 2013-03-21: qty 500

## 2013-03-21 MED ORDER — MIDAZOLAM HCL 2 MG/2ML IJ SOLN
INTRAMUSCULAR | Status: AC
  Filled 2013-03-21: qty 2

## 2013-03-21 MED ORDER — FENTANYL CITRATE 0.05 MG/ML IJ SOLN
INTRAMUSCULAR | Status: AC
  Filled 2013-03-21: qty 2

## 2013-03-21 MED ORDER — ASPIRIN 81 MG PO CHEW
81.0000 mg | CHEWABLE_TABLET | ORAL | Status: DC
  Filled 2013-03-21: qty 1

## 2013-03-21 MED ORDER — HEPARIN (PORCINE) IN NACL 2-0.9 UNIT/ML-% IJ SOLN
INTRAMUSCULAR | Status: AC
  Filled 2013-03-21: qty 1000

## 2013-03-21 MED ORDER — AMLODIPINE BESYLATE 5 MG PO TABS
5.0000 mg | ORAL_TABLET | ORAL | Status: AC
  Administered 2013-03-21: 5 mg via ORAL
  Filled 2013-03-21 (×2): qty 1

## 2013-03-21 MED ORDER — DIAZEPAM 5 MG PO TABS
ORAL_TABLET | ORAL | Status: AC
  Administered 2013-03-21: 5 mg
  Filled 2013-03-21: qty 1

## 2013-03-21 MED ORDER — ACETAMINOPHEN 325 MG PO TABS: 650.0000 mg | ORAL_TABLET | ORAL | Status: DC | PRN

## 2013-03-21 MED ORDER — ASPIRIN EC 325 MG PO TBEC: 325.0000 mg | DELAYED_RELEASE_TABLET | Freq: Every day | ORAL | Status: DC

## 2013-03-21 MED ORDER — SODIUM CHLORIDE 0.9 % IV SOLN: INTRAVENOUS | Status: AC

## 2013-03-21 MED ORDER — DIAZEPAM 5 MG PO TABS
5.0000 mg | ORAL_TABLET | ORAL | Status: DC
Start: 2013-03-21 — End: 2013-03-21

## 2013-03-21 MED ORDER — LIDOCAINE HCL (PF) 1 % IJ SOLN
INTRAMUSCULAR | Status: AC
  Filled 2013-03-21: qty 30

## 2013-03-21 MED ORDER — NITROGLYCERIN 0.2 MG/ML ON CALL CATH LAB
INTRAVENOUS | Status: AC
  Filled 2013-03-21: qty 1

## 2013-03-21 MED ORDER — VERAPAMIL HCL 2.5 MG/ML IV SOLN
INTRAVENOUS | Status: AC
  Filled 2013-03-21: qty 2

## 2013-03-21 MED ORDER — AMLODIPINE BESYLATE 5 MG PO TABS: 5.0000 mg | ORAL_TABLET | Freq: Every day | ORAL | Status: DC

## 2013-03-21 MED ORDER — SODIUM CHLORIDE 0.9 % IV SOLN: 250.0000 mL | INTRAVENOUS | Status: DC | PRN

## 2013-03-21 MED ORDER — ONDANSETRON HCL 4 MG/2ML IJ SOLN: 4.0000 mg | Freq: Four times a day (QID) | INTRAMUSCULAR | Status: DC | PRN

## 2013-03-21 MED ORDER — SODIUM CHLORIDE 0.9 % IJ SOLN: 3.0000 mL | Freq: Two times a day (BID) | INTRAMUSCULAR | Status: DC

## 2013-03-21 MED ORDER — SODIUM CHLORIDE 0.9 % IJ SOLN: 3.0000 mL | INTRAMUSCULAR | Status: DC | PRN

## 2013-03-21 NOTE — Progress Notes (Signed)
INR pending; IV start left forarm, instructed to bring pt. To cath lab with one IV

## 2013-03-21 NOTE — Interval H&P Note (Signed)
Cath Lab Visit (complete for each Cath Lab visit)  Clinical Evaluation Leading to the Procedure:   ACS: no  Non-ACS:    Anginal Classification: No Symptoms  Anti-ischemic medical therapy: No Therapy  Non-Invasive Test Results: Intermediate-risk stress test findings: cardiac mortality 1-3%/year  Prior CABG: No previous CABG   The patient has no prior history of coronary artery disease. She has a lung nodule that needs to be resected. CT scanning demonstrated heavy three-vessel coronary calcification. A nuclear perfusion study demonstrated an intermediate risk study. Prior to thoracotomy, coronary angiography to define anatomy is felt indicated.  The patient has immune leukopenia. She has recently seen her hematologist, Dr. Rennis Harding in [place]. He gave her a Neupogen shot last week and feels that she should be able to safely undergo angiography. I will check a CBC today to simply document the white count.  The procedure and risks of infection, death, myocardial infarction, stroke, bleeding, allergy, kidney injury, among others were discussed in detail with the patient and family who except the risks and she is willing to proceed.   History and Physical Interval Note:  03/21/2013 9:09 AM  [name]  has presented today for surgery, with the diagnosis of Chest pain  The various methods of treatment have been discussed with the patient and family. After consideration of risks, benefits and other options for treatment, the patient has consented to  Procedure(s): LEFT HEART CATHETERIZATION WITH CORONARY ANGIOGRAM (N/A) as a surgical intervention .  The patient's history has been reviewed, patient examined, no change in status, stable for surgery.  I have reviewed the patient's chart and labs.  Questions were answered to the patient's satisfaction.     Olivia Nichols

## 2013-03-21 NOTE — H&P (View-Only) (Signed)
Patient ID: Olivia Nichols, female   DOB: [birth year], 67 y.o.   MRN: [id number]  ECG demonstrated normal sinus rhythm, left atrial abnormality, but otherwise normal.

## 2013-03-21 NOTE — CV Procedure (Signed)
      Diagnostic Cardiac Catheterization Report  Olivia Nichols  67 y.o.  female Dec 14, 1945  Procedure Date: 03/21/2013  Referring Physician: Kerin Perna, MD Primary Cardiologist:: Cherrie Gauze Leia Alf, M.D.   PROCEDURE:  Left heart catheterization with selective coronary angiography, left ventriculogram.  INDICATIONS:  Intermediate risk myocardial perfusion study with heavy calcification on chest CT. Patient is asymptomatic. The studies being done to exclude the possibility of high risk anatomy that would complicate lung resection.  The risks, benefits, and details of the procedure were explained to the patient.  The patient verbalized understanding and wanted to proceed.  Informed written consent was obtained.  PROCEDURE TECHNIQUE:  After Xylocaine anesthesia a 5 French cath slender sheath was placed in the right radial artery with a single anterior needle wall stick.   Coronary angiography was done using a 5 [place] JR 4 cm and JL 3.5 cm catheters.  Left ventriculography was done using the JR 4 catheter. Hand injection was delivered to opacify the LV.   CONTRAST:  Total of 100 cc.  COMPLICATIONS:  None.    HEMODYNAMICS:  Aortic pressure was 162/69 mmHg; LV pressure was 172/6 mmHg; LVEDP 9 mmHg.  There was no gradient between the left ventricle and aorta.    ANGIOGRAPHIC DATA:   The left main coronary artery is calcified and widely patent.  The left anterior descending artery is heavily calcified and contains an eccentric 50-70% proximal stenosis before the first diagonal. This lesion is not felt to represent a hemodynamically significant lesion in absence of symptoms and based upon the radiographic appearance..  The left circumflex artery is bifurcating and widely patent..  The right coronary artery is heavily calcified, dominant, and widely patent. The mid vessel contains less than 50% narrowing.  LEFT VENTRICULOGRAM:  Left ventricular angiogram was done in the 30 RAO  projection and revealed normal left ventricular wall motion and systolic function with an estimated ejection fraction of 60 %.  LVEDP was 9 mmHg.  IMPRESSIONS:  1. Severe coronary calcification involving the left main, LAD, circumflex, and RCA.  2. Intermediate stenosis in the proximal LAD, in the 50-70% range.  3. Widely patent circumflex and right coronary.   RECOMMENDATION:  Risk factor modification. Low-dose beta blocker therapy. Risk with lung resection should be low to moderate risk based primarily on her comorbid conditions. Coronary disease appears stable.[name]

## 2013-03-22 ENCOUNTER — Other Ambulatory Visit: Payer: Self-pay

## 2013-03-22 ENCOUNTER — Emergency Department (HOSPITAL_COMMUNITY): Payer: Medicare Other

## 2013-03-22 ENCOUNTER — Emergency Department (HOSPITAL_COMMUNITY)
Admission: EM | Admit: 2013-03-22 | Discharge: 2013-03-23 | Disposition: A | Discharge status: Home / Self Care | Payer: Medicare Other | Attending: Emergency Medicine | Admitting: Emergency Medicine

## 2013-03-22 ENCOUNTER — Encounter (HOSPITAL_COMMUNITY): Payer: Self-pay | Admitting: Emergency Medicine

## 2013-03-22 DIAGNOSIS — R55 Syncope and collapse: Secondary | ICD-10-CM

## 2013-03-22 DIAGNOSIS — F17200 Nicotine dependence, unspecified, uncomplicated: Secondary | ICD-10-CM | POA: Insufficient documentation

## 2013-03-22 DIAGNOSIS — R110 Nausea: Secondary | ICD-10-CM | POA: Insufficient documentation

## 2013-03-22 DIAGNOSIS — J4489 Other specified chronic obstructive pulmonary disease: Secondary | ICD-10-CM | POA: Insufficient documentation

## 2013-03-22 DIAGNOSIS — M069 Rheumatoid arthritis, unspecified: Secondary | ICD-10-CM | POA: Insufficient documentation

## 2013-03-22 DIAGNOSIS — R42 Dizziness and giddiness: Secondary | ICD-10-CM | POA: Insufficient documentation

## 2013-03-22 DIAGNOSIS — Z7982 Long term (current) use of aspirin: Secondary | ICD-10-CM | POA: Insufficient documentation

## 2013-03-22 DIAGNOSIS — J449 Chronic obstructive pulmonary disease, unspecified: Secondary | ICD-10-CM | POA: Insufficient documentation

## 2013-03-22 DIAGNOSIS — Z79899 Other long term (current) drug therapy: Secondary | ICD-10-CM | POA: Insufficient documentation

## 2013-03-22 DIAGNOSIS — Z862 Personal history of diseases of the blood and blood-forming organs and certain disorders involving the immune mechanism: Secondary | ICD-10-CM | POA: Insufficient documentation

## 2013-03-22 DIAGNOSIS — E785 Hyperlipidemia, unspecified: Secondary | ICD-10-CM | POA: Insufficient documentation

## 2013-03-22 DIAGNOSIS — Z85118 Personal history of other malignant neoplasm of bronchus and lung: Secondary | ICD-10-CM | POA: Insufficient documentation

## 2013-03-22 DIAGNOSIS — R5381 Other malaise: Secondary | ICD-10-CM | POA: Insufficient documentation

## 2013-03-22 DIAGNOSIS — I2510 Atherosclerotic heart disease of native coronary artery without angina pectoris: Secondary | ICD-10-CM | POA: Insufficient documentation

## 2013-03-22 DIAGNOSIS — M1990 Unspecified osteoarthritis, unspecified site: Secondary | ICD-10-CM | POA: Insufficient documentation

## 2013-03-22 DIAGNOSIS — Z9861 Coronary angioplasty status: Secondary | ICD-10-CM | POA: Insufficient documentation

## 2013-03-22 DIAGNOSIS — I10 Essential (primary) hypertension: Secondary | ICD-10-CM | POA: Insufficient documentation

## 2013-03-22 DIAGNOSIS — R209 Unspecified disturbances of skin sensation: Secondary | ICD-10-CM | POA: Insufficient documentation

## 2013-03-22 LAB — CBC WITH DIFFERENTIAL/PLATELET
Basophils Relative: 0 % (ref 0–1)
HCT: 35 % — ABNORMAL LOW (ref 36.0–46.0)
Hemoglobin: 12.2 g/dL (ref 12.0–15.0)
Lymphocytes Relative: 26 % (ref 12–46)
Lymphs Abs: 1 10*3/uL (ref 0.7–4.0)
MCH: 29.6 pg (ref 26.0–34.0)
Monocytes Absolute: 0.4 10*3/uL (ref 0.1–1.0)
Monocytes Relative: 10 % (ref 3–12)
Neutro Abs: 2.5 10*3/uL (ref 1.7–7.7)
Neutrophils Relative %: 64 % (ref 43–77)
Platelets: 262 10*3/uL (ref 150–400)
RBC: 4.12 MIL/uL (ref 3.87–5.11)
WBC: 4 10*3/uL (ref 4.0–10.5)

## 2013-03-22 LAB — COMPREHENSIVE METABOLIC PANEL
ALT: 11 U/L (ref 0–35)
Albumin: 3 g/dL — ABNORMAL LOW (ref 3.5–5.2)
Alkaline Phosphatase: 93 U/L (ref 39–117)
BUN: 10 mg/dL (ref 6–23)
CO2: 25 mEq/L (ref 19–32)
Chloride: 97 mEq/L (ref 96–112)
GFR calc Af Amer: >90 mL/min (ref >90)
GFR calc non Af Amer: 85 mL/min — ABNORMAL LOW (ref >90)
Glucose, Bld: 117 mg/dL — ABNORMAL HIGH (ref 70–99)
Potassium: 3.7 mEq/L (ref 3.5–5.1)
Sodium: 134 mEq/L — ABNORMAL LOW (ref 135–145)
Total Bilirubin: 0.3 mg/dL (ref 0.3–1.2)
Total Protein: 7.1 g/dL (ref 6.0–8.3)

## 2013-03-22 LAB — URINALYSIS, ROUTINE W REFLEX MICROSCOPIC
Bilirubin Urine: NEGATIVE
Hgb urine dipstick: NEGATIVE
Ketones, ur: NEGATIVE mg/dL
Leukocytes, UA: NEGATIVE
Nitrite: NEGATIVE
Protein, ur: NEGATIVE mg/dL
pH: 6.5 (ref 5.0–8.0)

## 2013-03-22 MED ORDER — SODIUM CHLORIDE 0.9 % IV SOLN
1000.0000 mL | Freq: Once | INTRAVENOUS | Status: AC
  Administered 2013-03-22: 1000 mL via INTRAVENOUS

## 2013-03-22 MED ORDER — SODIUM CHLORIDE 0.9 % IV SOLN
1000.0000 mL | INTRAVENOUS | Status: DC
  Administered 2013-03-22: 1000 mL via INTRAVENOUS

## 2013-03-22 NOTE — ED Notes (Signed)
C/o intermittent episodes dizziness with position changes x 6 months. Reports felt light headed & dizzy prior to both syncopal episodes. Vomited x 2, denies dizziness presently. Denies CP, palpitations, SOB, fever, chills, diarrhea.

## 2013-03-22 NOTE — ED Notes (Signed)
Pt placed on bedpan. Pt is aware she needs to give a urine sample.

## 2013-03-22 NOTE — ED Notes (Signed)
Pt is relaxing in the bed at this time with family at bedside

## 2013-03-22 NOTE — ED Notes (Signed)
Pt had syncopal episode at home, found by family on floor in bathroom. Pt A&OX4 upon EMS arrival, then witnessed another syncopal episode with n/v. Denies CP. Pt had cardiac cath yesterday with no intervention.

## 2013-03-22 NOTE — ED Notes (Signed)
Received report from off going RN and introduced self to the pt.  No new needs at this time but will continue to monitor the pt

## 2013-03-22 NOTE — ED Provider Notes (Signed)
CSN: [id number]     Arrival date & time 03/22/13  1830 History   First MD Initiated Contact with Patient 03/22/13 1834     Chief Complaint  Patient presents with  . Loss of Consciousness   (Consider location/radiation/quality/duration/timing/severity/associated sxs/prior Treatment) HPI Patient presents after 2 episodes of syncope versus altered mental status. History of present illness is per the patient and the family member. Patient has had similar episodes for months. She has been evaluated by her physicians several times, and had catheterization yesterday. Catheterization was notable for demonstration of disease without need for immediate intervention. Today she denies any chest pain throughout the day, including either before or after any of the episodes.  Each episode was similar; prodromal lightheadedness, nausea, moments of syncope versus nonresponsiveness. On recovery, there is no head pain, no chest pain. On my exam the patient notes that she is back to baseline, has no ongoing complaints. She does endorse a history of lung cancer for which she is not currently getting any therapy. Past Medical History  Diagnosis Date  . Tobacco abuse     Current smoker  . COPD (chronic obstructive pulmonary disease)   . Neutropenia, unspecified   . Rheumatoid arthritis   . Osteoarthritis   . Cancer 09/12/12    Squamous cell lung cancer. RIGHT LOWER LOBE PNA.  [name] Hyperlipidemia     On pravastatin  . Essential hypertension, benign     On Losartan  . Coronary atherosclerosis of native coronary artery     CT angiography 08/2012 showed significantly coronary calcification. On ASA. Stess Test 03/06/13 LV EF = 68%. High risk study due to ischemic dilatation & mid to distal anterior ischemia.   Past Surgical History  Procedure Laterality Date  . Lung biopsy  09/12/12    [facility]  . Cardiac catheterization     Family History  Problem Relation Age of Onset  . CAD Mother   .  Peripheral vascular disease Mother   . CVA Father   . CAD Father   . COPD Brother   . Mitral valve prolapse Child   . Diabetes Child   . Mitral valve prolapse Child   . CAD Brother   .      History  Substance Use Topics  . Smoking status: Current Every Day Smoker -- 1.50 packs/day for 45 years    Types: Cigarettes    Start date: 01/20/2013  . Smokeless tobacco: Not on file  . Alcohol Use: No   OB History   Grav Para Term Preterm Abortions TAB SAB Ect Mult Living                 Review of Systems  Constitutional:       Per HPI, otherwise negative  HENT:       Per HPI, otherwise negative  Respiratory:       Per HPI, otherwise negative  Cardiovascular:       Per HPI, otherwise negative  Gastrointestinal: Positive for nausea. Negative for vomiting.  Endocrine:       Negative aside from HPI  Genitourinary:       Neg aside from HPI   Musculoskeletal:       Per HPI, otherwise negative  Skin: Negative.   Neurological: Positive for dizziness, syncope, weakness, light-headedness and numbness. Negative for facial asymmetry and headaches.    Allergies  Codeine and Morphine and related  Home Medications   Current Outpatient Rx  Name  Route  Sig  Dispense  Refill  . albuterol (PROVENTIL HFA;VENTOLIN HFA) 108 (90 BASE) MCG/ACT inhaler   Inhalation   Inhale 2 puffs into the lungs every 4 (four) hours as needed for wheezing.         [name] aspirin 325 MG tablet   Oral   Take 325-650 mg by mouth daily. For pain         . clonazePAM (KLONOPIN) 1 MG tablet   Oral   Take 1 mg by mouth 2 (two) times daily as needed for anxiety.         [name] losartan (COZAAR) 50 MG tablet   Oral   Take 50 mg by mouth daily.         . pravastatin (PRAVACHOL) 40 MG tablet   Oral   Take 40 mg by mouth at bedtime.          BP 137/64  Pulse 79  Temp(Src) 96.9 F (36.1 C) (Oral)  Resp 19  SpO2 96% Physical Exam  Nursing note and vitals reviewed. Constitutional: She is oriented to  person, place, and time. She appears well-developed and well-nourished. She appears ill.  Elderly appearing female, resting in gurney  HENT:  Head: Normocephalic and atraumatic.  Eyes: Conjunctivae and EOM are normal.  Cardiovascular: Normal rate and regular rhythm.   Pulmonary/Chest: Effort normal. No stridor. No respiratory distress. She has decreased breath sounds. She has no wheezes.  Abdominal: She exhibits no distension.  Musculoskeletal: She exhibits no edema.  Neurological: She is alert and oriented to person, place, and time. No cranial nerve deficit.  Skin: Skin is warm and dry. Ecchymosis noted. She is not diaphoretic.  Psychiatric: She has a normal mood and affect.    ED Course  Procedures (including critical care time)  11:16 PM I discussed all findings with the patient.  We specifically discussed the unusual episodes that she has had today.  She reiterates that she is currently asymptomatic, and wants to go home.  I reviewed risks / benefits explicitly, including death and she elected discharge.  Labs Review Labs Reviewed  CBC WITH DIFFERENTIAL - Abnormal; Notable for the following:    HCT 35.0 (*)    All other components within normal limits  COMPREHENSIVE METABOLIC PANEL  URINALYSIS, ROUTINE W REFLEX MICROSCOPIC  POCT CBG (FASTING - GLUCOSE)-MANUAL ENTRY   Imaging Review No results found.  EKG Interpretation   None      O2- 98%ra, normal  EKG has sinus rhythm, rate 70, nonspecific ST changes, but are unchanged from prior.  ABNORMAL  MDM  This patient with multiple medical problems, including lung cancer, known CAD, COPD, now presents after a series of episodes that are similar to those she has been experiencing 4 months. Note the patient's recent evaluation including catheterization, without intervention. On my exam, and throat patient's emergency room course she had no new complaints, remained hemodynamic stable, with mild hypertension, but otherwise  unremarkable vital signs.  Given the patient's prescription for syncope versus seizure versus unspecified nonresponsiveness, I recommended admission for further evaluation and management.  Patient elected for discharge.  Although this was not following the medical recommendation, given the fact that these episodes have been happening for months, this request is reasonable.  I had an extensive discussion with the patient about risks and benefits of departure, which she was in understanding of prior to discharge. She was made aware of the need to return for concerning changes in her condition, and otherwise to followup with her primary care physician the  Gerhard Munch, MD 03/22/13 [phone]

## 2013-03-26 ENCOUNTER — Ambulatory Visit: Payer: Medicare Other | Admitting: Cardiothoracic Surgery

## 2013-04-02 ENCOUNTER — Ambulatory Visit (INDEPENDENT_AMBULATORY_CARE_PROVIDER_SITE_OTHER): Payer: Medicare Other | Admitting: Cardiothoracic Surgery

## 2013-04-02 DIAGNOSIS — C3430 Malignant neoplasm of lower lobe, unspecified bronchus or lung: Secondary | ICD-10-CM

## 2013-04-02 NOTE — Progress Notes (Signed)
PCP is Ailene Ravel, MD Referring Provider is Lesleigh Noe, MD  Chief Complaint  Patient presents with  . Lung Cancer    f/u after cardiac clearance    HPI: 67 year old chronically ill frail Caucasian female smoker returns for further discussion of biopsy-proven right lower lobe squamous cell carcinoma. She was last seen in late August and needed a cardiology clearance prior to possible pulmonary section. Myocardial perfusion scan was high risk for ischemia and subsequent cardiac catheterization was recently performed by Dr. Katrinka Blazing. This demonstrates a mild to moderate mid LAD stenosis not hemodynamically significant. Ejection fraction 60%. Patient was placed on a beta blocker and was cleared for surgery.  The patient stopped smoking after the original thoracic surgical consultation but unfortunately resumed smoking 5 days ago. She also had another fall at home and possibly a seizure. [facility] and observed overnight. Brain MRI showed no evidence of tumor or other focus of origin for the seizure. She is currently extremely dizzy with walking and from discussing her home situation with her daughters it is apparent that she has had multiple falls in the recent past. There is no alcohol history. She does have fairly severe rheumatoid arthritis.  The patient was referred from [facility] by Dr. Lissa Hoard. Poorly function testing earlier this summer demonstrated FEV2.6 FEV1 1.9-70% of predicted. Diffusion capacity was only 44% of predicted. Ambulation the patient in the office today on room air demonstrates oxygen saturation of 97% at rest following to 96% following ambulation.  Review of her chest CT scan performed in early summer demonstrates the she also has a small 1.5 cm nodule in the right upper lobe with minimal activity on PET scan. This may be a synchronous tumor, it was not biopsied. Mediastinal activities unremarkable on PET scan.  With the patient  currently smoking, with recent falls and a possible seizure, and very fragile-appearing condition patient is now a very poor candidate for surgical resection. Since last CT of chest was several months ago we will repeat a scan and LC her back in one week for a final opinion regarding surgery. The other option would be stereotactic radiation to her right lower lobe tumor and probable followup of the right upper lobe lesion. Past Medical History  Diagnosis Date  . Tobacco abuse     Current smoker  . COPD (chronic obstructive pulmonary disease)   . Neutropenia, unspecified   . Rheumatoid arthritis   . Osteoarthritis   . Cancer 09/12/12    Squamous cell lung cancer. RIGHT LOWER LOBE PNA.  [name] Hyperlipidemia     On pravastatin  . Essential hypertension, benign     On Losartan  . Coronary atherosclerosis of native coronary artery     CT angiography 08/2012 showed significantly coronary calcification. On ASA. Stess Test 03/06/13 LV EF = 68%. High risk study due to ischemic dilatation & mid to distal anterior ischemia.    Past Surgical History  Procedure Laterality Date  . Lung biopsy  09/12/12    [facility]  . Cardiac catheterization      Family History  Problem Relation Age of Onset  . CAD Mother   . Peripheral vascular disease Mother   . CVA Father   . CAD Father   . COPD Brother   . Mitral valve prolapse Child   . Diabetes Child   . Mitral valve prolapse Child   . CAD Brother   .       Social History  History  Substance Use Topics  . Smoking status: Current Every Day Smoker -- 1.50 packs/day for 45 years    Types: Cigarettes    Start date: 01/20/2013  . Smokeless tobacco: Not on file  . Alcohol Use: No    Current Outpatient Prescriptions  Medication Sig Dispense Refill  . albuterol (PROVENTIL HFA;VENTOLIN HFA) 108 (90 BASE) MCG/ACT inhaler Inhale 2 puffs into the lungs every 4 (four) hours as needed for wheezing.      [name] aspirin 325 MG tablet Take 325-650 mg by mouth  daily. For pain      . clonazePAM (KLONOPIN) 1 MG tablet Take 1 mg by mouth 2 (two) times daily as needed for anxiety.      . fenofibrate 160 MG tablet Take 160 mg by mouth daily.      [name] losartan (COZAAR) 50 MG tablet Take 50 mg by mouth daily.      . pravastatin (PRAVACHOL) 40 MG tablet Take 40 mg by mouth at bedtime.      . predniSONE (DELTASONE) 5 MG tablet Take 5 mg by mouth daily.      . traMADol (ULTRAM) 50 MG tablet Take 50 mg by mouth every 6 (six) hours as needed for pain.      . traZODone (DESYREL) 50 MG tablet Take 50 mg by mouth at bedtime. One or two hs as needed      . [DISCONTINUED] lisinopril (PRINIVIL,ZESTRIL) 20 MG tablet Take 20 mg by mouth daily.       No current facility-administered medications for this visit.    Allergies  Allergen Reactions  . Codeine Other (See Comments)    Passes out  . Morphine And Related Other (See Comments)    Patient assumes she is allergic to morphine because she is allergic to codeine    Review of Systems unsteady on her feet Chronically ill  BP 139/73  Pulse 76  Resp 16  Ht 5\' 8"  (1.727 m)  Wt 128 lb (58.06 kg)  BMI 19.47 kg/m2  SpO2 97% Physical Exam Fragile chronically ill Bilateral dry rales Heart rhythm regular 1+ pedal edema  Diagnostic Tests: Results of coronary tear grams left heart cath reviewed with patient and family demonstrating normal EF, mild to moderate LAD disease. Impression: Biopsy proven right lower lobe squamous cell carcinoma the patient with significant COPD, low diffusion capacity, frequent falls and recently resumed smoking. We'll repeat chest CT scan to determine if there is been any change and make final decision next week regarding surgery.  Plan:

## 2013-04-03 ENCOUNTER — Encounter: Payer: Self-pay | Admitting: *Deleted

## 2013-04-03 ENCOUNTER — Other Ambulatory Visit: Payer: Self-pay | Admitting: *Deleted

## 2013-04-03 DIAGNOSIS — R911 Solitary pulmonary nodule: Secondary | ICD-10-CM

## 2013-04-08 ENCOUNTER — Encounter: Payer: Medicare Other | Admitting: Cardiothoracic Surgery

## 2013-04-08 ENCOUNTER — Other Ambulatory Visit: Payer: Medicare Other

## 2013-04-08 ENCOUNTER — Ambulatory Visit
Admission: RE | Admit: 2013-04-08 | Discharge: 2013-04-08 | Disposition: A | Discharge status: Home / Self Care | Payer: Medicare Other | Source: Ambulatory Visit | Attending: Cardiothoracic Surgery | Admitting: Cardiothoracic Surgery

## 2013-04-08 DIAGNOSIS — R911 Solitary pulmonary nodule: Secondary | ICD-10-CM

## 2013-04-08 MED ORDER — IOHEXOL 300 MG/ML  SOLN
75.0000 mL | Freq: Once | INTRAMUSCULAR | Status: AC | PRN
  Administered 2013-04-08: 75 mL via INTRAVENOUS

## 2013-04-08 NOTE — Progress Notes (Signed)
Dr. Donata Clay was in surgery and had to cancel her office visit. She will be rescheduled.

## 2013-04-09 ENCOUNTER — Telehealth: Payer: Self-pay | Admitting: *Deleted

## 2013-04-09 NOTE — Telephone Encounter (Signed)
I called patient to follow up from appointment with Dr. Donata Clay on 04/02/13.  Patient had CT scan performed yesterday and she reports that she is awaiting appointment with Radiation Oncologist at [facility]  Patient reports that she is worried and we discussed radiation therapy.  Patient denied any other questions or concerns at this time.  I verified that she has my contact information and I encouraged her to call me or any needs or if she needs to talk.  Patient verbalized understanding.

## 2013-04-14 ENCOUNTER — Ambulatory Visit: Payer: Medicare Other | Admitting: Cardiothoracic Surgery

## 2013-04-29 NOTE — Progress Notes (Signed)
Thoracic Location of Tumor / Histology:  Right lung squamous cell carcinoma - one nodule in right lower lobe and one nodule in right upper lobe  Patient presented with symptoms of: weakness.  Biopsies of right lung mass (if applicable) revealed:    Non-small cell carcinoma consistent with squamous cell carcinoma  Tobacco/Marijuana/Snuff/ETOH use: 45 year 1.5 pack per day smoking history.  Quit 3 weeks ago.  Uses nicotine patch.  Past/Anticipated interventions by cardiothoracic surgery, if any: patient is not a candidate for surgery.  Past/Anticipated interventions by medical oncology, if any: unknown  Signs/Symptoms  Weight changes, if any: none - patient says she has gained 10 lbs but states her appetite is poor.  Respiratory complaints, if any: coughing when she lays down.    Hemoptysis, if any: none  Pain issues, if any:  Patient has rheumatoid arthritis.  Rating pain at a 10/10 in her hands.  Tramadol helps with the pain.  SAFETY ISSUES:  Prior radiation? no  Pacemaker/ICD? no  Possible current pregnancy? no  Is the patient on methotrexate? no  Current Complaints / other details:  Patient has rheumatoid arthritis.  She also has neutropenia.  Patient has 4 children.  She is here with her two daughters today.

## 2013-04-30 ENCOUNTER — Encounter: Payer: Self-pay | Admitting: Radiation Oncology

## 2013-04-30 ENCOUNTER — Ambulatory Visit
Admission: RE | Admit: 2013-04-30 | Discharge: 2013-04-30 | Disposition: A | Discharge status: Home / Self Care | Payer: Medicare Other | Source: Ambulatory Visit | Attending: Radiation Oncology | Admitting: Radiation Oncology

## 2013-04-30 VITALS — BP 102/80 | HR 84 | Temp 98.0°F | Ht 68.0 in | Wt 130.7 lb

## 2013-04-30 DIAGNOSIS — J4489 Other specified chronic obstructive pulmonary disease: Secondary | ICD-10-CM | POA: Insufficient documentation

## 2013-04-30 DIAGNOSIS — Z79899 Other long term (current) drug therapy: Secondary | ICD-10-CM | POA: Insufficient documentation

## 2013-04-30 DIAGNOSIS — C3491 Malignant neoplasm of unspecified part of right bronchus or lung: Secondary | ICD-10-CM

## 2013-04-30 DIAGNOSIS — I10 Essential (primary) hypertension: Secondary | ICD-10-CM | POA: Insufficient documentation

## 2013-04-30 DIAGNOSIS — Z87891 Personal history of nicotine dependence: Secondary | ICD-10-CM | POA: Insufficient documentation

## 2013-04-30 DIAGNOSIS — E785 Hyperlipidemia, unspecified: Secondary | ICD-10-CM | POA: Insufficient documentation

## 2013-04-30 DIAGNOSIS — J449 Chronic obstructive pulmonary disease, unspecified: Secondary | ICD-10-CM | POA: Insufficient documentation

## 2013-04-30 DIAGNOSIS — M069 Rheumatoid arthritis, unspecified: Secondary | ICD-10-CM | POA: Insufficient documentation

## 2013-04-30 DIAGNOSIS — I2510 Atherosclerotic heart disease of native coronary artery without angina pectoris: Secondary | ICD-10-CM | POA: Insufficient documentation

## 2013-04-30 DIAGNOSIS — C3490 Malignant neoplasm of unspecified part of unspecified bronchus or lung: Secondary | ICD-10-CM | POA: Insufficient documentation

## 2013-04-30 NOTE — Progress Notes (Signed)
Radiation Oncology         [phone]) [phone] ________________________________  Initial outpatient Consultation  Name: Olivia Nichols MRN: [id number]  Date: 04/30/2013  DOB: 02-27-1946  MW:UXLKGMW,NUUVO L, MD  Weston Settle, MD   REFERRING PHYSICIAN: Weston Settle, MD  DIAGNOSIS: Clinical stage I non-small cell lung cancer presenting in the right lower lung field  HISTORY OF PRESENT ILLNESS::Olivia Nichols is a 67 y.o. female who seen out courtesy of Dr. Rennis Harding for an opinion concerning radiation therapy as part of management of patient's  squamous cell carcinoma of the right lung. Patient has been found to have 2 pulmonary nodules in the right lung. Most recent chest CT scan shows stability of the right upper lung lesion however the lesion within the right lower lobe has increased in size. This lesion was positive on PET scan earlier this year. A biopsy of the lower lesion did reveal squamous cell carcinoma.  The patient was seen by Dr. Donata Clay for consideration for surgery. She underwent cardiac workup and was found to be high risk for surgical intervention and has restarted smoking. It was  recommended the patient be considered for stereotactic body radiation therapy of her lower lesion and close followup of her right upper lobe lesion.  PREVIOUS RADIATION THERAPY: No  PAST MEDICAL HISTORY:  has a past medical history of Tobacco abuse; COPD (chronic obstructive pulmonary disease); Neutropenia, unspecified; Rheumatoid arthritis; Osteoarthritis; Cancer (09/12/12); Hyperlipidemia; Essential hypertension, benign; and Coronary atherosclerosis of native coronary artery.    PAST SURGICAL HISTORY: Past Surgical History  Procedure Laterality Date  . Lung biopsy  09/12/12    [facility]  . Cardiac catheterization    . Cataract extraction Left   . Skin cancer excision      top lip, right side    FAMILY HISTORY: family history includes CAD in her brother, father, and mother;  COPD in her brother; CVA in her father; Diabetes in her child; Mitral valve prolapse in her child and child; Peripheral vascular disease in her mother.  SOCIAL HISTORY:  reports that she quit smoking about 4 weeks ago. Her smoking use included Cigarettes. She has a 67.5 pack-year smoking history. She does not have any smokeless tobacco history on file. She reports that she does not drink alcohol or use illicit drugs.  ALLERGIES: Codeine and Morphine and related  MEDICATIONS:  Current Outpatient Prescriptions  Medication Sig Dispense Refill  . albuterol (PROVENTIL HFA;VENTOLIN HFA) 108 (90 BASE) MCG/ACT inhaler Inhale 2 puffs into the lungs every 4 (four) hours as needed for wheezing.      [name] aspirin 325 MG tablet Take 325-650 mg by mouth daily. For pain      . clonazePAM (KLONOPIN) 1 MG tablet Take 1 mg by mouth 2 (two) times daily as needed for anxiety.      . fenofibrate 160 MG tablet Take 160 mg by mouth daily.      [name] losartan (COZAAR) 50 MG tablet Take 50 mg by mouth daily.      . nicotine (NICODERM CQ - DOSED IN MG/24 HOURS) 14 mg/24hr patch Place 14 mg onto the skin every other day.      . pravastatin (PRAVACHOL) 40 MG tablet Take 40 mg by mouth at bedtime.      . predniSONE (DELTASONE) 5 MG tablet Take 5 mg by mouth daily.      . traMADol (ULTRAM) 50 MG tablet Take 50 mg by mouth every 6 (six) hours as needed for pain.      [name]  traZODone (DESYREL) 50 MG tablet Take 50 mg by mouth at bedtime. One or two hs as needed      . [DISCONTINUED] lisinopril (PRINIVIL,ZESTRIL) 20 MG tablet Take 20 mg by mouth daily.       No current facility-administered medications for this encounter.    REVIEW OF SYSTEMS:  A 15 point review of systems is documented in the electronic medical record. This was obtained by the nursing staff. However, I reviewed this with the patient to discuss relevant findings and make appropriate changes.  She has recently had a cough which does keep her awake at night. The patient  was diagnosed with pneumonia at a urgent care center . She has recently completed a course of antibiotics for this situation. Patient denies any pain in the chest area or hemoptysis. She denies any new bony pain but has chronic pain related to rheumatoid arthritis. She denies new headaches. She will awakes with a mild headache daily.   PHYSICAL EXAM:  height is 5\' 8"  (1.727 m) and weight is 130 lb 11.2 oz (59.285 kg). Her temperature is 98 F (36.7 C). Her blood pressure is 102/80 and her pulse is 84. Her oxygen saturation is 97%.   BP 102/80  Pulse 84  Temp(Src) 98 F (36.7 C)  Ht 5\' 8"  (1.727 m)  Wt 130 lb 11.2 oz (59.285 kg)  BMI 19.88 kg/m2  SpO2 97%  General Appearance:    Alert, cooperative, no distress, appears older than stated age, accompanied by daughters on evaluation today   Head:    Normocephalic, without obvious abnormality, atraumatic  Eyes:    PERRL, conjunctiva/corneas clear, EOM's intact,      Ears:    Normal TM's and external ear canals, both ears  Nose:   Nares normal, septum midline, mucosa normal, no drainage    or sinus tenderness  Throat:   Lips, mucosa, and tongue normal; edentulous gums normal  Neck:   Supple, symmetrical, trachea midline, no adenopathy;    thyroid:  no enlargement/tenderness/nodules; no carotid   bruit or JVD  Back:     Symmetric, no curvature, ROM normal, no CVA tenderness  Lungs:     Clear to auscultation bilaterally, respirations unlabored, bibasilar crackles   Chest Wall:    No tenderness or deformity   Heart:    Regular rate and rhythm, S1 and S2 normal, no murmur, rub   or gallop     Abdomen:     Soft, non-tender, bowel sounds active all four quadrants,    no masses, no organomegaly        Extremities:   Extremities normal, atraumatic, no cyanosis , some edema in the ankle and foot area   Pulses:   2+ and symmetric all extremities  Skin:   thin skin which bruises easily , possible skin cancers along the left neck region, the patient  is scheduled for dermatologic evaluation of this issue   Lymph nodes:   Cervical, supraclavicular, and axillary nodes normal  Neurologic:   CNII-XII intact, normal strength,        ECOG = 2  LABORATORY DATA:  Lab Results  Component Value Date   WBC 4.0 03/22/2013   HGB 12.2 03/22/2013   HCT 35.0* 03/22/2013   MCV 85.0 03/22/2013   PLT 262 03/22/2013   Lab Results  Component Value Date   NA 134* 03/22/2013   K 3.7 03/22/2013   CL 97 03/22/2013   CO2 25 03/22/2013   Lab Results  Component  Value Date   ALT 11 03/22/2013   AST 20 03/22/2013   ALKPHOS 93 03/22/2013   BILITOT 0.3 03/22/2013     RADIOGRAPHY: Ct Chest W Contrast  04/08/2013   CLINICAL DATA:  Followup lung nodule, history of carcinoma of the right lung with surgery in April  EXAM: CT CHEST WITH CONTRAST  TECHNIQUE: Multidetector CT imaging of the chest was performed during intravenous contrast administration.  CONTRAST:  75mL OMNIPAQUE IOHEXOL 300 MG/ML  SOLN  COMPARISON:  Chest x-ray of 03/22/2013 and chest x-ray of 12/14/2011  FINDINGS: On lung window images there is a somewhat irregular nodule within the medial right upper lobe of 13 mm in diameter. In addition, in the deep posterior right lower lobe sulcus, there is and oval nodule of 18 x 28 mm present as well. These findings are concerning for possible primary low lung carcinoma and metastatic disease. PET-CT is recommended to assess further. Prominent interstitial markings also noted with some subpleural reticulation and mild honeycombing suggesting idiopathic pulmonary fibrosis. A probable small calcified granuloma is noted peripherally in the left lower lobe. No pleural effusion is seen. The central airway is patent.  On soft tissue window images, the thyroid gland is normal in size. Atheromatous change is noted diffusely throughout the thoracic aorta. There are coronary artery calcifications present primarily within the distribution of the left anterior descending  artery. No abnormality of the upper abdomen is noted, other than the spleen being slightly prominent. On sagittal images of there is a thoracic kyphosis present. .  IMPRESSION: 1. 18 x 28 mm oval nodule in the right lower lobe posteriorly, with 13 mm nodule in the medial right upper lobe, worrisome for primary and or metastatic involvement of the right lung. Consider PET-CT to assess metabolic activity. 2. Interstitial lung disease with probable honeycombing. 3. Coronary artery calcifications.   Electronically Signed   By: Dwyane Dee M.D.   On: 04/08/2013 13:38      IMPRESSION: Clinical stage I non-small cell lung cancer presenting in the right lower lung field. She is not a good candidate for surgery in light of her medical issues. She would be a good candidate for a definitive course of radiation therapy for this lesion, most likely SBRT.  In light of the stability of the right upper lobe lesion I would recommend close followup of this lesion, but to consider treatment of this lesion at a later date if necessary.  PLAN: Patient will be scheduled for stereotactic body radiation therapy simulation next week. Anticipate between 3 and 5 radiation treatments directed at the right lower lung lesion.  I spent 60 minutes minutes face to face with the patient and more than 50% of that time was spent in counseling and/or coordination of care.   ------------------------------------------------  -----------------------------------  Billie Lade, PhD, MD

## 2013-04-30 NOTE — Progress Notes (Signed)
Please see the Nurse Progress Note in the MD Initial Consult Encounter for this patient. 

## 2013-05-05 ENCOUNTER — Telehealth: Payer: Self-pay | Admitting: *Deleted

## 2013-05-05 NOTE — Telephone Encounter (Signed)
Called to check in with patient and to remind her of her appointment for radiation simulation 05/07/13.  Patient reports that she is doing well.  I asked about her smoking and she stated that she was doing well with not smoking.  She denies any questions or concerns at this time.  We confirmed her appointment time.  I encouraged her to call me for any needs.

## 2013-05-07 ENCOUNTER — Ambulatory Visit
Admission: RE | Admit: 2013-05-07 | Discharge: 2013-05-07 | Disposition: A | Discharge status: Home / Self Care | Payer: Medicare Other | Source: Ambulatory Visit | Attending: Radiation Oncology | Admitting: Radiation Oncology

## 2013-05-07 DIAGNOSIS — Z79899 Other long term (current) drug therapy: Secondary | ICD-10-CM | POA: Insufficient documentation

## 2013-05-07 DIAGNOSIS — Z510 Encounter for antineoplastic radiation therapy: Secondary | ICD-10-CM | POA: Insufficient documentation

## 2013-05-07 DIAGNOSIS — C3430 Malignant neoplasm of lower lobe, unspecified bronchus or lung: Secondary | ICD-10-CM | POA: Insufficient documentation

## 2013-05-07 DIAGNOSIS — Z7982 Long term (current) use of aspirin: Secondary | ICD-10-CM | POA: Insufficient documentation

## 2013-05-07 DIAGNOSIS — R05 Cough: Secondary | ICD-10-CM | POA: Insufficient documentation

## 2013-05-07 DIAGNOSIS — C3491 Malignant neoplasm of unspecified part of right bronchus or lung: Secondary | ICD-10-CM

## 2013-05-07 DIAGNOSIS — R059 Cough, unspecified: Secondary | ICD-10-CM | POA: Insufficient documentation

## 2013-05-13 NOTE — Progress Notes (Signed)
  Radiation Oncology         [phone] ________________________________  Name: Olivia Nichols MRN: [id number]  Date: 05/07/2013  DOB: [birth year]  SIMULATION AND TREATMENT PLANNING NOTE  DIAGNOSIS:  Squamous cell carcinoma of the right lung  NARRATIVE:  The patient was brought to the CT Simulation planning suite.  Identity was confirmed.  All relevant records and images related to the planned course of therapy were reviewed.  The patient freely provided informed written consent to proceed with treatment after reviewing the details related to the planned course of therapy. The consent form was witnessed and verified by the simulation staff.  Then, the patient was set-up in a stable reproducible  supine position for radiation therapy.  CT images were obtained.  Surface markings were placed.  The CT images were loaded into the planning software.  Then the target and avoidance structures were contoured.  Treatment planning then occurred.  The radiation prescription was entered and confirmed.  Then, I designed and supervised the construction of a total of 2 medically necessary complex treatment devices.  I have requested : 3D Simulation  I have requested a DVH of the following structures: GTV, PTV, lungs, spinal cord, chest wall.  I have ordered:does calc.  PLAN:  The patient will receive 50 Gy in 10 fractions.  ________________________________ -----------------------------------  Billie Lade, PhD, MD

## 2013-05-13 NOTE — Progress Notes (Signed)
  Radiation Oncology         [phone] ________________________________  Name: Olivia Nichols MRN: [id number]  Date: 05/07/2013  DOB: [birth year]  RESPIRATORY MOTION MANAGEMENT SIMULATION  NARRATIVE:  In order to account for effect of respiratory motion on target structures and other organs in the planning and delivery of radiotherapy, this patient underwent respiratory motion management simulation.  To accomplish this, when the patient was brought to the CT simulation planning suite, 4D respiratoy motion management CT images were obtained.  The CT images were loaded into the planning software.  Then, using a variety of tools including Cine, MIP, and standard views, the target volume and planning target volumes (PTV) were delineated.  Avoidance structures were contoured.  Treatment planning then occurred.  Dose volume histograms were generated and reviewed for each of the requested structure.  The resulting plan was carefully reviewed and approved today.  -----------------------------------  Billie Lade, PhD, MD

## 2013-05-13 NOTE — Progress Notes (Signed)
[facility] Radiation Oncology Simulation and Treatment Planning Note   Name:  Olivia Nichols, Olivia Nichols MRN: [id number]   Date: 05/07/13 DOB: 04/15/1946  Status:outpatient    DIAGNOSIS: Squamous cell carcinoma of the right lung   CONSENT VERIFIED:yes   SET UP: Patient is setup supine   IMMOBILIZATION: The patient was immobilized using a Vac Loc bag and Abdominal Compression.   NARRATIVE:The patient was brought to the CT Simulation planning suite.  Identity was confirmed.  All relevant records and images related to the planned course of therapy were reviewed.  Then, the patient was positioned in a stable reproducible clinical set-up for radiation therapy. Abdominal compression was applied by me.  4D CT images were obtained and reproducible breathing pattern was confirmed. Free breathing CT images were obtained.  Skin markings were placed.  The CT images were loaded into the planning software where the target and avoidance structures were contoured.  The radiation prescription was entered and confirmed.    TREATMENT PLANNING NOTE:  Treatment planning then occurred. I have requested : MLC's, isodose plan, basic dose calculation.  3 dimensional simulation is performed and dose volume histogram of the gross tumor volume, planning tumor volume and criticial normal structures including the spinal cord and lungs were analyzed and requested.  Special treatment procedure was performed due to high dose per fraction.  The patient will be monitored for increased risk of toxicity.  Daily imaging using cone beam CT will be used for target localization.  -----------------------------------  Billie Lade, PhD, MD

## 2013-05-16 ENCOUNTER — Telehealth: Payer: Self-pay | Admitting: *Deleted

## 2013-05-16 NOTE — Telephone Encounter (Signed)
Patient's daughter called me in response to voicemail message I left for patient.  She reports that Olivia Nichols is doing well except for worrying about her financial limitations during the holiday season.  She asked if there were any resources that might assist her with Christmas presents for Ms. Mclane's grandchildren.  I told her that I would discuss with the social worker and would follow up with her.  We also reviewed Ms. Sforza's upcoming appointment schedule.  I encouraged her to call me for any needs.

## 2013-05-16 NOTE — Telephone Encounter (Signed)
I called patient to check in and to remind her of her appointment on 05/19/13.  There was no answer so I left a voicemail message with contact information.

## 2013-05-16 NOTE — Telephone Encounter (Signed)
I called patient's daughter back after consulting the social worker and financial advocate and instructed her to see one of the Radiation Oncology Financial Advocates when she comes in for her treatment on 05/19/13.  I informed her that they will  assess her financial needs and let her know if there is any assistance available.

## 2013-05-19 ENCOUNTER — Ambulatory Visit
Admission: RE | Admit: 2013-05-19 | Discharge: 2013-05-19 | Disposition: A | Discharge status: Home / Self Care | Payer: Medicare Other | Source: Ambulatory Visit | Attending: Radiation Oncology | Admitting: Radiation Oncology

## 2013-05-19 DIAGNOSIS — C3491 Malignant neoplasm of unspecified part of right bronchus or lung: Secondary | ICD-10-CM

## 2013-05-19 NOTE — Progress Notes (Signed)
  Radiation Oncology         [phone]) [phone] ________________________________  Name: Krisi Azua MRN: [id number]  Date: 05/19/2013  DOB: 1946-03-13  Stereotactic Body Radiotherapy Treatment Procedure Note  NARRATIVE:  Pollyanna Levay was brought to the stereotactic radiation treatment machine and placed supine on the CT couch. The patient was set up for stereotactic body radiotherapy on the body fix pillow.  3D TREATMENT PLANNING AND DOSIMETRY:  The patient's radiation plan was reviewed and approved prior to starting treatment.  It showed 3-dimensional radiation distributions overlaid onto the planning CT.  The [facility] for the target structures as well as the organs at risk were reviewed. The documentation of this is filed in the radiation oncology EMR.  SIMULATION VERIFICATION:  The patient underwent CT imaging on the treatment unit.  These were carefully aligned to document that the ablative radiation dose would cover the target volume and maximally spare the nearby organs at risk according to the planned distribution.  SPECIAL TREATMENT PROCEDURE: [name] received high dose ablative stereotactic body radiotherapy to the planned target volume without unforeseen complications. Treatment was delivered uneventfully. The high doses associated with stereotactic body radiotherapy and the significant potential risks require careful treatment set up and patient monitoring constituting a special treatment procedure   STEREOTACTIC TREATMENT MANAGEMENT:  Following delivery, the patient was evaluated clinically. The patient tolerated treatment without significant acute effects, and was discharged to home in stable condition.    PLAN: Continue treatment as planned.  ________________________________  Billie Lade, PhD, MD

## 2013-05-21 ENCOUNTER — Ambulatory Visit
Admission: RE | Admit: 2013-05-21 | Discharge: 2013-05-21 | Disposition: A | Discharge status: Home / Self Care | Payer: Medicare Other | Source: Ambulatory Visit | Attending: Radiation Oncology | Admitting: Radiation Oncology

## 2013-05-21 DIAGNOSIS — C3491 Malignant neoplasm of unspecified part of right bronchus or lung: Secondary | ICD-10-CM

## 2013-05-23 ENCOUNTER — Ambulatory Visit
Admission: RE | Admit: 2013-05-23 | Discharge: 2013-05-23 | Disposition: A | Discharge status: Home / Self Care | Payer: Medicare Other | Source: Ambulatory Visit | Attending: Radiation Oncology | Admitting: Radiation Oncology

## 2013-05-23 VITALS — BP 118/65 | HR 82 | Temp 98.3°F | Ht 68.0 in | Wt 134.0 lb

## 2013-05-23 DIAGNOSIS — C3491 Malignant neoplasm of unspecified part of right bronchus or lung: Secondary | ICD-10-CM

## 2013-05-23 MED ORDER — RADIAPLEXRX EX GEL
Freq: Once | CUTANEOUS | Status: AC
  Administered 2013-05-23: 16:00:00 via TOPICAL

## 2013-05-23 MED ORDER — PROMETHAZINE-DM 6.25-15 MG/5ML PO SYRP: 1.2500 mL | ORAL_SOLUTION | Freq: Four times a day (QID) | ORAL | Status: DC | PRN

## 2013-05-23 NOTE — Addendum Note (Signed)
Encounter addended by: Eduardo Osier, RN on: 05/23/2013  3:30 PM<BR>     Documentation filed: Inpatient MAR

## 2013-05-23 NOTE — Progress Notes (Signed)
  Radiation Oncology         [phone] ________________________________  Name: Kye Silverstein MRN: [id number]  Date: 05/23/2013  DOB: September 11, 1945  Stereotactic Body Radiotherapy Treatment Procedure Note  NARRATIVE:  Sharmeka Palmisano was brought to the stereotactic radiation treatment machine and placed supine on the CT couch. The patient was set up for stereotactic body radiotherapy on the body fix pillow.  3D TREATMENT PLANNING AND DOSIMETRY:  The patient's radiation plan was reviewed and approved prior to starting treatment.  It showed 3-dimensional radiation distributions overlaid onto the planning CT.  The [facility] for the target structures as well as the organs at risk were reviewed. The documentation of this is filed in the radiation oncology EMR.  SIMULATION VERIFICATION:  The patient underwent CT imaging on the treatment unit.  These were carefully aligned to document that the ablative radiation dose would cover the target volume and maximally spare the nearby organs at risk according to the planned distribution.  SPECIAL TREATMENT PROCEDURE: [name] received high dose ablative stereotactic body radiotherapy to the planned target volume without unforeseen complications. Treatment was delivered uneventfully. The high doses associated with stereotactic body radiotherapy and the significant potential risks require careful treatment set up and patient monitoring constituting a special treatment procedure   STEREOTACTIC TREATMENT MANAGEMENT:  Following delivery, the patient was evaluated clinically. The patient tolerated treatment without significant acute effects, and was discharged to home in stable condition.    PLAN: Continue treatment as planned.  ________________________________  Billie Lade, PhD, MD

## 2013-05-23 NOTE — Progress Notes (Addendum)
Olivia Nichols has had 3 fractions to her right lower lung.  She denies pain.  She does have a dry cough.  She has been taking promethazine-dextromethorphan (PROMETHAZINE-DM) 6.25-15 MG/5ML syrup that she had from urgent care and is needing a refill.  Her skin is intact in the treatment area.  She denies shortness of breath and a sore throat.  She does have sore throat.  She was given radiaplex gel and was instructed to apply it to the treatment area twice a day.  She was given the Radiation Therapy and You book and discussed potential side effects including fatigue, skin changes and throat changes.

## 2013-05-23 NOTE — Addendum Note (Signed)
Encounter addended by: Eduardo Osier, RN on: 05/23/2013 11:47 AM<BR>     Documentation filed: Notes Section, Orders

## 2013-05-23 NOTE — Progress Notes (Signed)
[facility]    Radiation Oncology [address] [place]     Maryln Gottron, M.D. [place], [place] [address]               Billie Lade, M.D., Ph.D. Phone: [phone]      Molli Hazard A. Kathrynn Running, M.D. Fax: [phone]      Radene Gunning, M.D., Ph.D.         Lurline Hare, M.D.         Grayland Jack, M.D Weekly Treatment Management Note  Name: Olivia Nichols     MRN: [id number]        CSN: [id number] Date: 05/23/2013      DOB: 1945/10/28  CC: Ailene Ravel, MD         [place]    Status: Outpatient  Diagnosis: Clinical stage I non-small cell lung cancer presenting in the right lower lung  Current Dose: 30 Gy  Current Fraction: 3  Planned Dose: 50 Gy  Narrative: Annell Canty was seen today for weekly treatment management. The chart was checked and CBCT  were reviewed. She is tolerating her SBRT well at this time. She does have a chronic dry cough which she  had prior to starting her radiation treatment. She denies any shortness of breath or chest pain. Patient is about to run out of cough medicine which I refilled this today.  Codeine and Morphine and related Current Outpatient Prescriptions  Medication Sig Dispense Refill  . albuterol (PROVENTIL HFA;VENTOLIN HFA) 108 (90 BASE) MCG/ACT inhaler Inhale 2 puffs into the lungs every 4 (four) hours as needed for wheezing.      [name] aspirin 325 MG tablet Take 325-650 mg by mouth daily. For pain      . clonazePAM (KLONOPIN) 1 MG tablet Take 1 mg by mouth 2 (two) times daily as needed for anxiety.      . fenofibrate 160 MG tablet Take 160 mg by mouth daily.      [name] losartan (COZAAR) 50 MG tablet Take 50 mg by mouth daily.      . nicotine (NICODERM CQ - DOSED IN MG/24 HOURS) 14 mg/24hr patch Place 14 mg onto the skin every other day.      . pravastatin (PRAVACHOL) 40 MG tablet Take 40 mg by mouth at bedtime.      . predniSONE (DELTASONE) 5 MG tablet Take 5 mg by mouth daily.      . promethazine-dextromethorphan  (PROMETHAZINE-DM) 6.25-15 MG/5ML syrup Take 1.3 mLs by mouth 4 (four) times daily as needed for cough.  118 mL  0  . traMADol (ULTRAM) 50 MG tablet Take 50 mg by mouth every 6 (six) hours as needed for pain.      . traZODone (DESYREL) 50 MG tablet Take 50 mg by mouth at bedtime. One or two hs as needed      . [DISCONTINUED] lisinopril (PRINIVIL,ZESTRIL) 20 MG tablet Take 20 mg by mouth daily.       No current facility-administered medications for this encounter.   Labs:  Lab Results  Component Value Date   WBC 4.0 03/22/2013   HGB 12.2 03/22/2013   HCT 35.0* 03/22/2013   MCV 85.0 03/22/2013   PLT 262 03/22/2013   Lab Results  Component Value Date   CREATININE 0.74 03/22/2013   BUN 10 03/22/2013   NA 134* 03/22/2013   K 3.7 03/22/2013   CL 97 03/22/2013   CO2 25 03/22/2013   Lab Results  Component Value Date  ALT 11 03/22/2013   AST 20 03/22/2013   BILITOT 0.3 03/22/2013    Physical Examination:  Filed Vitals:   05/23/13 1121  BP: 118/65  Pulse: 82  Temp: 98.3 F (36.8 C)    Wt Readings from Last 3 Encounters:  05/23/13 134 lb (60.782 kg)  04/30/13 130 lb 11.2 oz (59.285 kg)  04/08/13 128 lb (58.06 kg)     Lungs - Normal respiratory effort, chest expands symmetrically. Lungs are clear to auscultation, no crackles or wheezes.  Heart has regular rhythm and rate  Abdomen is soft and non tender with normal bowel sounds  Assessment:  Patient tolerating treatments well  Plan: Continue treatment per original radiation prescription

## 2013-05-26 ENCOUNTER — Encounter: Payer: Self-pay | Admitting: Radiation Oncology

## 2013-05-26 ENCOUNTER — Ambulatory Visit
Admission: RE | Admit: 2013-05-26 | Discharge: 2013-05-26 | Disposition: A | Discharge status: Home / Self Care | Payer: Medicare Other | Source: Ambulatory Visit | Attending: Radiation Oncology | Admitting: Radiation Oncology

## 2013-05-26 NOTE — Progress Notes (Signed)
  Radiation Oncology         [phone] ________________________________  Name: Olivia Nichols MRN: [id number]  Date: 05/26/2013  DOB: January 02, 1946  Stereotactic Body Radiotherapy Treatment Procedure Note  NARRATIVE:  Olivia Nichols was brought to the stereotactic radiation treatment machine and placed supine on the CT couch. The patient was set up for stereotactic body radiotherapy on the body fix pillow.  3D TREATMENT PLANNING AND DOSIMETRY:  The patient's radiation plan was reviewed and approved prior to starting treatment.  It showed 3-dimensional radiation distributions overlaid onto the planning CT.  The [facility] for the target structures as well as the organs at risk were reviewed. The documentation of this is filed in the radiation oncology EMR.  SIMULATION VERIFICATION:  The patient underwent CT imaging on the treatment unit.  These were carefully aligned to document that the ablative radiation dose would cover the right lung target volume and maximally spare the nearby organs at risk according to the planned distribution.  SPECIAL TREATMENT PROCEDURE: Olivia Nichols received high dose ablative stereotactic body radiotherapy to the planned right lung target volume without unforeseen complications. Treatment was delivered uneventfully. She received an additional 1000 cGy for a cumulative dose of 4000 cGy of a prescribed 5000 cGy in 5 sessions. The high doses associated with stereotactic body radiotherapy and the significant potential risks require careful treatment set up and patient monitoring constituting a special treatment procedure   STEREOTACTIC TREATMENT MANAGEMENT:  Following delivery, the patient was evaluated clinically. The patient tolerated treatment without significant acute effects, and was discharged to home in stable condition.    PLAN: Continue treatment as planned.  ------------------------------------------------        Maryln Gottron, MD

## 2013-05-28 ENCOUNTER — Ambulatory Visit
Admission: RE | Admit: 2013-05-28 | Discharge: 2013-05-28 | Disposition: A | Discharge status: Home / Self Care | Payer: Medicare Other | Source: Ambulatory Visit | Attending: Radiation Oncology | Admitting: Radiation Oncology

## 2013-05-28 DIAGNOSIS — C3491 Malignant neoplasm of unspecified part of right bronchus or lung: Secondary | ICD-10-CM

## 2013-05-28 NOTE — Progress Notes (Signed)
  Radiation Oncology         [phone] ________________________________  Name: Olivia Nichols MRN: [id number]  Date: 05/28/2013  DOB: 1945/11/08  Stereotactic Body Radiotherapy Treatment Procedure Note  NARRATIVE:  Olivia Nichols was brought to the stereotactic radiation treatment machine and placed supine on the CT couch. The patient was set up for stereotactic body radiotherapy on the body fix pillow.  3D TREATMENT PLANNING AND DOSIMETRY:  The patient's radiation plan was reviewed and approved prior to starting treatment.  It showed 3-dimensional radiation distributions overlaid onto the planning CT.  The [facility] for the target structures as well as the organs at risk were reviewed. The documentation of this is filed in the radiation oncology EMR.  SIMULATION VERIFICATION:  The patient underwent CT imaging on the treatment unit.  These were carefully aligned to document that the ablative radiation dose would cover the target volume and maximally spare the nearby organs at risk according to the planned distribution.  SPECIAL TREATMENT PROCEDURE: Olivia Nichols received high dose ablative stereotactic body radiotherapy to the planned target volume without unforeseen complications. Treatment was delivered uneventfully. The high doses associated with stereotactic body radiotherapy and the significant potential risks require careful treatment set up and patient monitoring constituting a special treatment procedure   STEREOTACTIC TREATMENT MANAGEMENT:  Following delivery, the patient was evaluated clinically. The patient tolerated treatment without significant acute effects, and was discharged to home in stable condition.    PLAN: Continue treatment as planned.  ________________________________  Billie Lade, PhD, MD

## 2013-06-13 ENCOUNTER — Telehealth: Payer: Self-pay

## 2013-06-13 NOTE — Telephone Encounter (Signed)
Patient's daughter Olivia Nichols called with questions regarding joint pain, especially of hand.Informed she needs to see her PCP for arthritic pain but that she does need to come in for follow up since completing radiation in December 2014.Scheduled appointment for June 23, 2013.Knows to arrive at 1:15 pm to register for follow up.

## 2013-06-15 ENCOUNTER — Encounter: Payer: Self-pay | Admitting: Radiation Oncology

## 2013-06-15 NOTE — Progress Notes (Signed)
  Radiation Oncology         [phone] ________________________________  Name: Olivia Nichols MRN: [id number]  Date: 06/15/2013  DOB: 03-May-1946  End of Treatment Note  Diagnosis:     Clinical stage I non-small cell lung cancer presenting in the right lower lung field   Indication for treatment:    Definitive treatment   Radiation treatment dates:   December 15, December 17, December 19, December 22, December 24  Site/dose:   Right lower lung nodule, 50  gray in 5 fractions  Beams/energy:   SBRT techniques using 6 MV photons  Narrative: The patient tolerated radiation treatment relatively well.   She experienced some mild fatigue but no respiratory difficulties.  She continued to have a chronic dry cough which she had prior to starting radiation therapy  Plan: The patient has completed radiation treatment. The patient will return to radiation oncology clinic for routine followup in one month. I advised them to call or return sooner if they have any questions or concerns related to their recovery or treatment.  -----------------------------------  Blair Promise, PhD, MD

## 2013-06-18 ENCOUNTER — Encounter: Payer: Self-pay | Admitting: Oncology

## 2013-06-20 ENCOUNTER — Inpatient Hospital Stay (HOSPITAL_COMMUNITY)
Admission: AD | Admit: 2013-06-20 | Discharge: 2013-06-23 | DRG: 871 | Disposition: A | Discharge status: Home / Self Care | Payer: Medicare Other | Source: Other Acute Inpatient Hospital | Attending: Internal Medicine | Admitting: Internal Medicine

## 2013-06-20 ENCOUNTER — Encounter: Payer: Self-pay | Admitting: *Deleted

## 2013-06-20 ENCOUNTER — Encounter (HOSPITAL_COMMUNITY): Payer: Self-pay | Admitting: *Deleted

## 2013-06-20 ENCOUNTER — Telehealth: Payer: Self-pay | Admitting: Oncology

## 2013-06-20 DIAGNOSIS — F05 Delirium due to known physiological condition: Secondary | ICD-10-CM | POA: Diagnosis present

## 2013-06-20 DIAGNOSIS — Z8249 Family history of ischemic heart disease and other diseases of the circulatory system: Secondary | ICD-10-CM

## 2013-06-20 DIAGNOSIS — A419 Sepsis, unspecified organism: Principal | ICD-10-CM

## 2013-06-20 DIAGNOSIS — F17200 Nicotine dependence, unspecified, uncomplicated: Secondary | ICD-10-CM | POA: Diagnosis present

## 2013-06-20 DIAGNOSIS — D649 Anemia, unspecified: Secondary | ICD-10-CM | POA: Diagnosis present

## 2013-06-20 DIAGNOSIS — M1990 Unspecified osteoarthritis, unspecified site: Secondary | ICD-10-CM | POA: Diagnosis present

## 2013-06-20 DIAGNOSIS — R799 Abnormal finding of blood chemistry, unspecified: Secondary | ICD-10-CM

## 2013-06-20 DIAGNOSIS — I2510 Atherosclerotic heart disease of native coronary artery without angina pectoris: Secondary | ICD-10-CM

## 2013-06-20 DIAGNOSIS — R778 Other specified abnormalities of plasma proteins: Secondary | ICD-10-CM

## 2013-06-20 DIAGNOSIS — I10 Essential (primary) hypertension: Secondary | ICD-10-CM | POA: Diagnosis present

## 2013-06-20 DIAGNOSIS — J189 Pneumonia, unspecified organism: Secondary | ICD-10-CM

## 2013-06-20 DIAGNOSIS — J4489 Other specified chronic obstructive pulmonary disease: Secondary | ICD-10-CM | POA: Diagnosis present

## 2013-06-20 DIAGNOSIS — J449 Chronic obstructive pulmonary disease, unspecified: Secondary | ICD-10-CM

## 2013-06-20 DIAGNOSIS — R7989 Other specified abnormal findings of blood chemistry: Secondary | ICD-10-CM

## 2013-06-20 DIAGNOSIS — Z923 Personal history of irradiation: Secondary | ICD-10-CM

## 2013-06-20 DIAGNOSIS — Z23 Encounter for immunization: Secondary | ICD-10-CM

## 2013-06-20 DIAGNOSIS — C3490 Malignant neoplasm of unspecified part of unspecified bronchus or lung: Secondary | ICD-10-CM

## 2013-06-20 DIAGNOSIS — M069 Rheumatoid arthritis, unspecified: Secondary | ICD-10-CM | POA: Diagnosis present

## 2013-06-20 DIAGNOSIS — E785 Hyperlipidemia, unspecified: Secondary | ICD-10-CM | POA: Diagnosis present

## 2013-06-20 DIAGNOSIS — Z823 Family history of stroke: Secondary | ICD-10-CM

## 2013-06-20 DIAGNOSIS — R0902 Hypoxemia: Secondary | ICD-10-CM | POA: Diagnosis present

## 2013-06-20 DIAGNOSIS — Z833 Family history of diabetes mellitus: Secondary | ICD-10-CM

## 2013-06-20 DIAGNOSIS — IMO0002 Reserved for concepts with insufficient information to code with codable children: Secondary | ICD-10-CM

## 2013-06-20 DIAGNOSIS — Z7982 Long term (current) use of aspirin: Secondary | ICD-10-CM

## 2013-06-20 LAB — MRSA PCR SCREENING: MRSA BY PCR: NEGATIVE

## 2013-06-20 LAB — LACTIC ACID, PLASMA: LACTIC ACID, VENOUS: 1.4 mmol/L (ref 0.5–2.2)

## 2013-06-20 LAB — HIV ANTIBODY (ROUTINE TESTING W REFLEX): HIV: NONREACTIVE

## 2013-06-20 LAB — TROPONIN I: Troponin I: <0.3 ng/mL (ref <0.30)

## 2013-06-20 LAB — STREP PNEUMONIAE URINARY ANTIGEN: Strep Pneumo Urinary Antigen: NEGATIVE

## 2013-06-20 MED ORDER — SIMVASTATIN 20 MG PO TABS
20.0000 mg | ORAL_TABLET | Freq: Every day | ORAL | Status: DC
  Administered 2013-06-20 – 2013-06-22 (×3): 20 mg via ORAL
  Filled 2013-06-20 (×4): qty 1

## 2013-06-20 MED ORDER — ASPIRIN EC 81 MG PO TBEC
81.0000 mg | DELAYED_RELEASE_TABLET | Freq: Every day | ORAL | Status: DC
  Administered 2013-06-20 – 2013-06-23 (×4): 81 mg via ORAL
  Filled 2013-06-20 (×4): qty 1

## 2013-06-20 MED ORDER — SODIUM CHLORIDE 0.9 % IV SOLN
INTRAVENOUS | Status: DC
  Administered 2013-06-20: 999 mL via INTRAVENOUS

## 2013-06-20 MED ORDER — PNEUMOCOCCAL VAC POLYVALENT 25 MCG/0.5ML IJ INJ
0.5000 mL | INJECTION | INTRAMUSCULAR | Status: AC
  Administered 2013-06-23: 0.5 mL via INTRAMUSCULAR
  Filled 2013-06-20: qty 0.5

## 2013-06-20 MED ORDER — NICOTINE 14 MG/24HR TD PT24
14.0000 mg | MEDICATED_PATCH | TRANSDERMAL | Status: DC
  Administered 2013-06-21: 14 mg via TRANSDERMAL
  Filled 2013-06-20 (×2): qty 1

## 2013-06-20 MED ORDER — LEVOFLOXACIN IN D5W 750 MG/150ML IV SOLN
750.0000 mg | INTRAVENOUS | Status: DC
  Administered 2013-06-20 – 2013-06-21 (×2): 750 mg via INTRAVENOUS
  Filled 2013-06-20 (×4): qty 150

## 2013-06-20 MED ORDER — ONDANSETRON HCL 4 MG PO TABS: 4.0000 mg | ORAL_TABLET | Freq: Four times a day (QID) | ORAL | Status: DC | PRN

## 2013-06-20 MED ORDER — ACETAMINOPHEN 650 MG RE SUPP
650.0000 mg | Freq: Four times a day (QID) | RECTAL | Status: DC | PRN
Start: 2013-06-20 — End: 2013-06-23

## 2013-06-20 MED ORDER — DEXTROSE 5 % IV SOLN
1.0000 g | Freq: Three times a day (TID) | INTRAVENOUS | Status: DC
  Administered 2013-06-20 – 2013-06-22 (×6): 1 g via INTRAVENOUS
  Filled 2013-06-20 (×9): qty 1

## 2013-06-20 MED ORDER — PREDNISONE 50 MG PO TABS
60.0000 mg | ORAL_TABLET | Freq: Every day | ORAL | Status: DC
  Administered 2013-06-20 – 2013-06-22 (×3): 60 mg via ORAL
  Filled 2013-06-20 (×3): qty 1

## 2013-06-20 MED ORDER — ACETAMINOPHEN 325 MG PO TABS: 650.0000 mg | ORAL_TABLET | Freq: Four times a day (QID) | ORAL | Status: DC | PRN

## 2013-06-20 MED ORDER — OXYCODONE HCL 5 MG PO TABS
5.0000 mg | ORAL_TABLET | ORAL | Status: DC | PRN
  Administered 2013-06-20: 5 mg via ORAL
  Filled 2013-06-20: qty 1

## 2013-06-20 MED ORDER — ONDANSETRON HCL 4 MG/2ML IJ SOLN: 4.0000 mg | Freq: Four times a day (QID) | INTRAMUSCULAR | Status: DC | PRN

## 2013-06-20 MED ORDER — ENOXAPARIN SODIUM 40 MG/0.4ML ~~LOC~~ SOLN
40.0000 mg | SUBCUTANEOUS | Status: DC
  Administered 2013-06-20 – 2013-06-22 (×3): 40 mg via SUBCUTANEOUS
  Filled 2013-06-20 (×5): qty 0.4

## 2013-06-20 MED ORDER — VANCOMYCIN HCL 500 MG IV SOLR
500.0000 mg | Freq: Two times a day (BID) | INTRAVENOUS | Status: DC
  Administered 2013-06-20 – 2013-06-22 (×4): 500 mg via INTRAVENOUS
  Filled 2013-06-20 (×6): qty 500

## 2013-06-20 MED ORDER — ALBUTEROL SULFATE (2.5 MG/3ML) 0.083% IN NEBU: 2.5000 mg | INHALATION_SOLUTION | RESPIRATORY_TRACT | Status: DC | PRN

## 2013-06-20 MED ORDER — RADIAPLEXRX EX GEL: 1.0000 "application " | Freq: Two times a day (BID) | CUTANEOUS | Status: DC | PRN

## 2013-06-20 MED ORDER — FENOFIBRATE 160 MG PO TABS
160.0000 mg | ORAL_TABLET | Freq: Every day | ORAL | Status: DC
  Administered 2013-06-20 – 2013-06-23 (×4): 160 mg via ORAL
  Filled 2013-06-20 (×4): qty 1

## 2013-06-20 MED ORDER — SODIUM CHLORIDE 0.9 % IJ SOLN
3.0000 mL | Freq: Two times a day (BID) | INTRAMUSCULAR | Status: DC
  Administered 2013-06-20: 3 mL via INTRAVENOUS

## 2013-06-20 MED ORDER — CLONAZEPAM 0.5 MG PO TABS
0.5000 mg | ORAL_TABLET | Freq: Two times a day (BID) | ORAL | Status: DC | PRN
  Administered 2013-06-21 – 2013-06-22 (×2): 0.5 mg via ORAL
  Filled 2013-06-20 (×2): qty 1

## 2013-06-20 NOTE — Telephone Encounter (Signed)
Olivia Nichols's daughter, Olivia Nichols called and said her mother was confused this morning and "acting funny."  She was taken by ambulance to [facility] in [place].  Olivia Nichols said she had a heart attack.  She is wondering if [name] should be transferred to [facility] or [facility].  Advised her that it would be up to [facility] and the doctors at [facility].  Olivia Nichols said [facility] [place] cardiologist is in [place] and she had a cardiac catheterization last year in [place]. She was also wondering what kind of radiation that [name] had.  Advised her that it was SBRT.to the right lower lung.

## 2013-06-20 NOTE — Progress Notes (Signed)
[facility] Psychosocial Distress Screening  Clinical Social Work   Clinical Social Work was referred by distress screening protocol. The patient scored a 7 on the Psychosocial Distress Thermometer which indicates moderate distress. Patient currently hospitalized, however Clinical Social Worker contacted patient's daughter by phone to assess for distress and other psychosocial needs. The patient currently experiencing financial issues.  CSW completed paperwork for [employer] with patient's daughter and faxed application. CSW encouraged patient to call with any questions or concerns.   Clinical Social Worker follow up needed: no  If yes, follow up plan:  Polo Riley, MSW, LCSW, OSW-C  Clinical Social Worker  [facility]  [phone]

## 2013-06-20 NOTE — H&P (Signed)
[facility] History and Physical  Olivia Nichols XIP:[id number] DOB: 1945/09/17 DOA: 06/20/2013   PCP: Olivia Sake, MD  Specialists: Cardiologist is Dr. Tamala Nichols. She has been followed by radiation oncologist Dr. Sondra Nichols, for history of squamous cell lung cancer.  Chief Complaint: Confusion, fever, and cough  HPI: Olivia Nichols is a 68 y.o. female with a past medical history of a non-small cell lung cancer, COPD, coronary artery disease, on medical management, who was taken to the emergency room at [facility] this morning, as the patient was apparently confused. [name] No family is available at this time but according to the notes sent over from that hospital she was complaining of left-sided chest pain. She is confused. When EMS arrived they found her oxygen saturations to be about 89 percent and temperature was 100.8. She was taken to the emergency department. Patient does tell me that she has been having a cough since yesterday evening. It has been clear in color. She has had sharp, left-sided chest pain on and off since yesterday and it increases with deep breathing. Denies any chest pain currently. Denies any nausea, or vomiting. No sick contacts. She denies any diarrhea. Patient lives with her daughters. She has also been short of breath. While she was evaluated in the local hospital she was found to have a mildly elevated troponin of 0.1. Due to her history of cardiac disease she was transferred over to [facility] for further management. Evaluation in the emergency department also revealed left lower lobe infiltrate.  Home Medications: Prior to Admission medications   Medication Sig Start Date End Date Taking? Authorizing Provider  albuterol (PROVENTIL HFA;VENTOLIN HFA) 108 (90 BASE) MCG/ACT inhaler Inhale 2 puffs into the lungs every 4 (four) hours as needed for wheezing.   Yes Historical Provider, MD  aspirin 325 MG tablet Take  325-650 mg by mouth daily. For pain   Yes Historical Provider, MD  clonazePAM (KLONOPIN) 1 MG tablet Take 1 mg by mouth 2 (two) times daily as needed for anxiety.   Yes Historical Provider, MD  fenofibrate 160 MG tablet Take 160 mg by mouth daily.   Yes Historical Provider, MD  hyaluronate sodium (RADIAPLEXRX) GEL Apply 1 application topically 2 (two) times daily as needed (pain).    Yes Historical Provider, MD  losartan (COZAAR) 50 MG tablet Take 50 mg by mouth daily.   Yes Historical Provider, MD  nicotine (NICODERM CQ - DOSED IN MG/24 HOURS) 14 mg/24hr patch Place 14 mg onto the skin every other day.   Yes Historical Provider, MD  pravastatin (PRAVACHOL) 40 MG tablet Take 40 mg by mouth at bedtime.   Yes Historical Provider, MD  predniSONE (DELTASONE) 5 MG tablet Take 5 mg by mouth daily.   Yes Historical Provider, MD  traMADol (ULTRAM) 50 MG tablet Take 50 mg by mouth every 6 (six) hours as needed for pain.   Yes Historical Provider, MD  traZODone (DESYREL) 50 MG tablet Take 50 mg by mouth at bedtime.    Yes Historical Provider, MD    Allergies:  Allergies  Allergen Reactions  . Codeine Other (See Comments)    Passes out  . Morphine And Related Other (See Comments)    Patient assumes she is allergic to morphine because she is allergic to codeine    Past Medical History: Past Medical History  Diagnosis Date  . Tobacco abuse     Current smoker  . COPD (chronic obstructive pulmonary disease)   .  Neutropenia, unspecified   . Rheumatoid arthritis   . Osteoarthritis   . Cancer 09/12/12    Squamous cell lung cancer. RIGHT LOWER LOBE PNA.  [name] Hyperlipidemia     On pravastatin  . Essential hypertension, benign     On Losartan  . Coronary atherosclerosis of native coronary artery     CT angiography 08/2012 showed significantly coronary calcification. On ASA. Stess Test 03/06/13 LV EF = 68%. High risk study due to ischemic dilatation & mid to distal anterior ischemia.  [name] History of  radiation therapy 05/19/13, 05/21/2013, 05/23/2013, 05/26/2013    SBRT right lower lung nodule, 50 gray    Past Surgical History  Procedure Laterality Date  . Lung biopsy  09/12/12    [facility]  . Cardiac catheterization    . Cataract extraction Left   . Skin cancer excision      top lip, right side    Social History: Patient lives with her daughters. She quit smoking about a year and half ago, when she was diagnosed with lung cancer. No alcohol use. She tells me that she is usually independent with daily activities.  Family History:  Family History  Problem Relation Age of Onset  . CAD Mother   . Peripheral vascular disease Mother   . CVA Father   . CAD Father   . COPD Brother   . Mitral valve prolapse Child   . Diabetes Child   . Mitral valve prolapse Child   . CAD Brother      Review of Systems - History obtained from the patient General ROS: positive for  - fatigue Psychological ROS: negative Ophthalmic ROS: negative ENT ROS: negative Allergy and Immunology ROS: negative Hematological and Lymphatic ROS: negative Endocrine ROS: negative Respiratory ROS: as in hpi Cardiovascular ROS: as in hpi Gastrointestinal ROS: no abdominal pain, change in bowel habits, or black or bloody stools Genito-Urinary ROS: no dysuria, trouble voiding, or hematuria Musculoskeletal ROS: negative Neurological ROS: no TIA or stroke symptoms Dermatological ROS: negative  Physical Examination  Filed Vitals:   06/20/13 1330  BP: 113/54  Pulse: 84  TempSrc: Oral  Resp: 20  Height: 5\' 8"  (1.727 m)  Weight: 60.2 kg (132 lb 11.5 oz)  SpO2: 96%    General appearance: alert, cooperative, appears stated age and no distress Head: Normocephalic, without obvious abnormality, atraumatic Eyes: conjunctivae/corneas clear. PERRL, EOM's intact.  Throat: lips, mucosa, and tongue normal; teeth and gums normal Neck: no adenopathy, no carotid bruit, no JVD, supple, symmetrical, trachea  midline and thyroid not enlarged, symmetric, no tenderness/mass/nodules Back: symmetric, no curvature. ROM normal. No CVA tenderness. Resp: crackles bilateral bases left more than right. No wheezing. Cardio: regular rate and rhythm, S1, S2 normal, no murmur, click, rub or gallop GI: soft, non-tender; bowel sounds normal; no masses,  no organomegaly Extremities: extremities normal, atraumatic, no cyanosis or edema Pulses: 2+ and symmetric Skin: Skin color, texture, turgor normal. No rashes or lesions Lymph nodes: Cervical, supraclavicular, and axillary nodes normal. Neurologic: Alert oriented. No focal deficits.  Laboratory Data: Blood work reviewed from outside facility. UA was negative for infection. Influenza A and B were negative. WBC count was 5.5, with 79% neutrophils. Hemoglobin 10.1, MCV 82. Platelet count 403,000. Sodium was 135, potassium 3.5, chloride 98, bicarbonate 21, BUN 9, creatinine 0.9. Calcium 8.3. PT, INR were normal. PTT was normal. Troponin was 0.106. BNP was 1046.  Radiology Reports: Chest x-ray report is read as left lower lobe infiltrate consistent with pneumonia. Underlying  chronic lung disease.  Electrocardiogram: EKG shows sinus rhythm at 100 beats per minute. Possibly normal axis. Intervals appear to be normal. No definite Q waves. No concerning ST or T-wave changes noted.  Problem List  Principal Problem:   HCAP (healthcare-associated pneumonia) Active Problems:   Squamous cell lung cancer   Rheumatoid arthritis   COPD (chronic obstructive pulmonary disease)   Coronary atherosclerosis of native coronary artery   Elevated troponin   Pneumonia involving left lung   Assessment: This is a 68 year old, Caucasian female, who presents with fever and cough and altered mental status. However it appears, that her mental status has returned to baseline. She was found to have a temperature of 101.8 earlier this morning. Blood pressure was also low at 81/38. With IV  fluids this has improved. So, she does have a pneumonia and possible sepsis which is now improved. Considering that she was getting radiation treatment for cancer this pneumonia could be considered either Healthcare associated or community-acquired. Also she is immunosuppressed as she is on chronic steroids.  Plan: #1 left lower lobe pneumonia with sepsis: Considering her history we will treat her with broad-spectrum coverage with vancomycin and cefepime. We will add Levaquin for atypicals. Continue with gentle IV hydration. EF from the ventriculogram during her cardiac catheterization in October was normal. Lactic acid will be checked. She doesn't seem to require any oxygen at this time.   #2 elevated troponin/likely demand ischemia: Continue to trend her troponin levels. Recent cardiac catheterization report reviewed. She does have known CAD, however, is currently on medical management. Depending on the trend of troponins, Echocardiogram may be considered. At this time there is no clear indication for anticoagulation however if her troponin rises significantly, this will be considered. Chest pain is pleuritic and most likely from pneumonia.  #3 history of squamous cell lung cancer: She has completed radiation treatment. She has been seen by cardiothoracic surgery to consider surgery for her lung cancer. However, at the evaluation back in October, she was considered a poor candidate due to her poor general health. Hence radiation was initiated.  #4 history of rheumatoid arthritis on chronic steroids: Stable. Due to her sepsis we will increase the dose of her prednisone. Since her blood pressure has stabilized with IV fluids no clear indication for intravenous stress dose steroids.  #5 history of COPD: Nebulizer as needed.  Acute confusion this morning was likely from hypoxia and low BP. She appears to be at baseline without focal deficits.  DVT Prophylaxis: Lovenox Code Status: Full code Family  Communication: Discussed with the patient  Disposition Plan: Leave in step down for tonight.   Further management decisions will depend on results of further testing and patient's response to treatment.  [facility]  [facility] Pager [phone]  If 7PM-7AM, please contact night-coverage [email or web address] Password [facility]  06/20/2013, 2:46 PM

## 2013-06-20 NOTE — Progress Notes (Signed)
ANTIBIOTIC CONSULT NOTE - INITIAL  Pharmacy Consult for vancomycin Indication: rule out pneumonia  Allergies  Allergen Reactions  . Codeine Other (See Comments)    Passes out  . Morphine And Related Other (See Comments)    Patient assumes she is allergic to morphine because she is allergic to codeine    Patient Measurements: Height: 5\' 8"  (172.7 cm) Weight: 132 lb 11.5 oz (60.2 kg) IBW/kg (Calculated) : 63.9 Adjusted Body Weight:   Vital Signs: Temp src: Oral (01/16 1330) BP: 113/54 mmHg (01/16 1330) Pulse Rate: 84 (01/16 1330) Intake/Output from previous day:   Intake/Output from this shift:    Labs: No results found for this basename: WBC, HGB, PLT, LABCREA, CREATININE,  in the last 72 hours Estimated Creatinine Clearance: 63.1 ml/min (by C-G formula based on Cr of 0.74). No results found for this basename: VANCOTROUGH, VANCOPEAK, VANCORANDOM, GENTTROUGH, GENTPEAK, GENTRANDOM, TOBRATROUGH, TOBRAPEAK, TOBRARND, AMIKACINPEAK, AMIKACINTROU, AMIKACIN,  in the last 72 hours   Microbiology: No results found for this or any previous visit (from the past 720 hour(s)).  Medical History: Past Medical History  Diagnosis Date  . Tobacco abuse     Current smoker  . COPD (chronic obstructive pulmonary disease)   . Neutropenia, unspecified   . Rheumatoid arthritis   . Osteoarthritis   . Cancer 09/12/12    Squamous cell lung cancer. RIGHT LOWER LOBE PNA.  [name] Hyperlipidemia     On pravastatin  . Essential hypertension, benign     On Losartan  . Coronary atherosclerosis of native coronary artery     CT angiography 08/2012 showed significantly coronary calcification. On ASA. Stess Test 03/06/13 LV EF = 68%. High risk study due to ischemic dilatation & mid to distal anterior ischemia.  [name] History of radiation therapy 05/19/13, 05/21/2013, 05/23/2013, 05/26/2013    SBRT right lower lung nodule, 50 gray    Medications:  Anti-infectives   Start     Dose/Rate Route Frequency Ordered  Stop   06/20/13 2200  vancomycin (VANCOCIN) 500 mg in sodium chloride 0.9 % 100 mL IVPB     500 mg 100 mL/hr over 60 Minutes Intravenous Every 12 hours 06/20/13 1501     06/20/13 1800  ceFEPIme (MAXIPIME) 1 g in dextrose 5 % 50 mL IVPB     1 g 100 mL/hr over 30 Minutes Intravenous Every 8 hours 06/20/13 1445 06/28/13 1759   06/20/13 1600  levofloxacin (LEVAQUIN) IVPB 750 mg     750 mg 100 mL/hr over 90 Minutes Intravenous Every 24 hours 06/20/13 1445 06/25/13 1559     Assessment: 68 yof presented from OSH with confusion, fever, and cough. Pt was afebrile and WBC was WNL at OSH. Scr reported at 0.9 at OSH. She received 1 dose of vancomycin & 1 dose cefepime at 0945 today at OSH. To continue vancomycin + cefepime and add levaquin.   Vanc 1/16>> Cefepime 1/16>> Levaquin 1/6>>  Goal of Therapy:  Vancomycin trough level 15-20 mcg/ml  Plan:  1. Vancomycin 500mg  IV Q12H tonight 2. Continue cefepime 1gm IV Q8H (adjusted start time d/t dose received at OSH) per MD 3. Continue levaquin 750mg  IV Q24H per MD 4. F/u renal fxn, C&S, clinical status and trough at [facility], Rande Lawman 06/20/2013,3:01 PM

## 2013-06-21 DIAGNOSIS — I2510 Atherosclerotic heart disease of native coronary artery without angina pectoris: Secondary | ICD-10-CM

## 2013-06-21 DIAGNOSIS — J449 Chronic obstructive pulmonary disease, unspecified: Secondary | ICD-10-CM

## 2013-06-21 LAB — COMPREHENSIVE METABOLIC PANEL
ALBUMIN: 2.2 g/dL — AB (ref 3.5–5.2)
ALT: 8 U/L (ref 0–35)
AST: 11 U/L (ref 0–37)
Alkaline Phosphatase: 70 U/L (ref 39–117)
BUN: 11 mg/dL (ref 6–23)
CALCIUM: 8 mg/dL — AB (ref 8.4–10.5)
CO2: 21 mEq/L (ref 19–32)
Chloride: 103 mEq/L (ref 96–112)
Creatinine, Ser: 0.6 mg/dL (ref 0.50–1.10)
GFR calc non Af Amer: >90 mL/min (ref >90)
Glucose, Bld: 135 mg/dL — ABNORMAL HIGH (ref 70–99)
Potassium: 4.1 mEq/L (ref 3.7–5.3)
Sodium: 135 mEq/L — ABNORMAL LOW (ref 137–147)
Total Bilirubin: 0.3 mg/dL (ref 0.3–1.2)
Total Protein: 5.8 g/dL — ABNORMAL LOW (ref 6.0–8.3)

## 2013-06-21 LAB — CBC
HEMATOCRIT: 25.3 % — AB (ref 36.0–46.0)
HEMOGLOBIN: 8.5 g/dL — AB (ref 12.0–15.0)
MCH: 28.1 pg (ref 26.0–34.0)
MCHC: 33.6 g/dL (ref 30.0–36.0)
MCV: 83.8 fL (ref 78.0–100.0)
Platelets: 293 10*3/uL (ref 150–400)
RBC: 3.02 MIL/uL — ABNORMAL LOW (ref 3.87–5.11)
RDW: 15.1 % (ref 11.5–15.5)
WBC: 3.8 10*3/uL — ABNORMAL LOW (ref 4.0–10.5)

## 2013-06-21 LAB — TROPONIN I: Troponin I: <0.3 ng/mL (ref <0.30)

## 2013-06-21 NOTE — Evaluation (Signed)
Physical Therapy Evaluation / Discharge Patient Details Name: Olivia Nichols MRN: [id number] DOB: February 23, 1946 Today's Date: 06/21/2013 Time: 3664-4034 PT Time Calculation (min): 11 min  PT Assessment / Plan / Recommendation History of Present Illness  Olivia Nichols is a 68 y.o. female with a past medical history of a non-small cell lung cancer, COPD, coronary artery disease, on medical management, who was taken to the emergency room at [facility] this morning, as the patient was apparently confused. [name] PNA  Clinical Impression  Pt moving well and currently at baseline functional status. Pt remained in mid to high 90s with oxygen sats on RA with HR 105. Pt without history of recent fall, dgtrs and grandchildren at home with no further needs- pt and dgtr agree. Signing off. Recommend daily ambulation with nursing.     PT Assessment  Patent does not need any further PT services    Follow Up Recommendations  No PT follow up    Does the patient have the potential to tolerate intense rehabilitation      Barriers to Discharge        Equipment Recommendations  None recommended by PT    Recommendations for Other Services     Frequency      Precautions / Restrictions Precautions Precautions: None   Pertinent Vitals/Pain No pain      Mobility  Bed Mobility Overal bed mobility: Modified Independent Transfers Overall transfer level: Modified independent Ambulation/Gait Ambulation/Gait assistance: Modified independent (Device/Increase time) Ambulation Distance (Feet): 350 Feet Assistive device: None Gait Pattern/deviations: WFL(Within Functional Limits) Gait velocity interpretation: at or above normal speed for age/gender Stairs: Yes Stairs assistance: Modified independent (Device/Increase time) Stair Management: One rail Right;Forwards;Step to pattern Number of Stairs: 1    Exercises     PT Diagnosis:    PT Problem  List:   PT Treatment Interventions:       PT Goals(Current goals can be found in the care plan section) Acute Rehab PT Goals PT Goal Formulation: No goals set, d/c therapy  Visit Information  Last PT Received On: 06/21/13 Assistance Needed: +1 History of Present Illness: Olivia Nichols is a 68 y.o. female with a past medical history of a non-small cell lung cancer, COPD, coronary artery disease, on medical management, who was taken to the emergency room at [facility] this morning, as the patient was apparently confused. [name] PNA       Prior [place] expects to be discharged to:: Private residence Living Arrangements: Children Available Help at Discharge: Family;Available 24 hours/day Type of Home: House Home Access: Stairs to enter [employer] of Steps: 1 Home Layout: One level Home Equipment: None Prior Function Level of Independence: Independent Comments: family does housework, cooking and driving Communication Communication: No difficulties    Cognition  Cognition Arousal/Alertness: Awake/alert Behavior During Therapy: WFL for tasks assessed/performed Overall Cognitive Status: Within Functional Limits for tasks assessed    Extremity/Trunk Assessment Upper Extremity Assessment Upper Extremity Assessment: Overall WFL for tasks assessed Lower Extremity Assessment Lower Extremity Assessment: Overall WFL for tasks assessed Cervical / Trunk Assessment Cervical / Trunk Assessment: Normal   Balance    End of Session PT - End of Session Equipment Utilized During Treatment: Gait belt Activity Tolerance: Patient tolerated treatment well Patient left: in chair;with call bell/phone within reach;with family/visitor present Nurse Communication: Mobility status  GP     Melford Aase 06/21/2013, 9:17 AM  Elwyn Reach, [place]

## 2013-06-21 NOTE — Progress Notes (Signed)
[facility] PROGRESS NOTE  Olivia Nichols WUJ:[id number] DOB: 11-16-1945 DOA: 06/20/2013  PCP: Leonides Sake, MD  Brief HPI: Olivia Nichols is a 68 y.o. female with a past medical history of a non-small cell lung cancer, COPD, coronary artery disease, on medical management, who was taken to the emergency room at [facility] 1/16 morning, as the patient was apparently confused. When EMS arrived they found her oxygen saturations to be about 89 percent and temperature was 100.8. She had been coughing starting a day earlier. She has had sharp, left-sided chest pain on and off. While she was evaluated in the local hospital she was found to have a mildly elevated troponin of 0.1. Due to her history of cardiac disease she was transferred over to [facility] for further management. Evaluation in the emergency department also revealed left lower lobe infiltrate.  Past medical history:  Past Medical History  Diagnosis Date  . Tobacco abuse     Current smoker  . COPD (chronic obstructive pulmonary disease)   . Neutropenia, unspecified   . Rheumatoid arthritis   . Osteoarthritis   . Cancer 09/12/12    Squamous cell lung cancer. RIGHT LOWER LOBE PNA.  [name] Hyperlipidemia     On pravastatin  . Essential hypertension, benign     On Losartan  . Coronary atherosclerosis of native coronary artery     CT angiography 08/2012 showed significantly coronary calcification. On ASA. Stess Test 03/06/13 LV EF = 68%. High risk study due to ischemic dilatation & mid to distal anterior ischemia.  [name] History of radiation therapy 05/19/13, 05/21/2013, 05/23/2013, 05/26/2013    SBRT right lower lung nodule, 50 gray    Consultants: None  Procedures: None  Antibiotics: Vanc 1/16--> Cefepime 1/16--> Levaquin 1/16-->  Subjective: Patient feels better. Had a few episodes of left sided chest pain but none currently. Ambulated with PT and did not have any pain. Cough persists.  Objective: Vital Signs  Filed  Vitals:   06/20/13 2027 06/21/13 0021 06/21/13 0408 06/21/13 0800  BP: 132/64 103/39 120/65 140/62  Pulse: 81 67 74 70  Temp: 98.2 F (36.8 C) 98 F (36.7 C) 97.7 F (36.5 C) 98.5 F (36.9 C)  TempSrc: Oral Axillary Oral Oral  Resp: 21 17 18 19   Height:      Weight:   62 kg (136 lb 11 oz)   SpO2: 97% 97% 99% 96%    Intake/Output Summary (Last 24 hours) at 06/21/13 0920 Last data filed at 06/21/13 0748  Gross per 24 hour  Intake    965 ml  Output   1100 ml  Net   -135 ml   Filed Weights   06/20/13 1330 06/21/13 0408  Weight: 60.2 kg (132 lb 11.5 oz) 62 kg (136 lb 11 oz)   General appearance: alert, cooperative, appears stated age and no distress Resp: Crackles left base. No wheezing. Cardio: regular rate and rhythm, S1, S2 normal, no murmur, click, rub or gallop GI: soft, non-tender; bowel sounds normal; no masses,  no organomegaly Extremities: extremities normal, atraumatic, no cyanosis or edema Neurologic: No focal deficits  Lab Results:  Basic Metabolic Panel:  Recent Labs Lab 06/21/13 0320  NA 135*  K 4.1  CL 103  CO2 21  GLUCOSE 135*  BUN 11  CREATININE 0.60  CALCIUM 8.0*   Liver Function Tests:  Recent Labs Lab 06/21/13 0320  AST 11  ALT 8  ALKPHOS 70  BILITOT 0.3  PROT 5.8*  ALBUMIN 2.2*   CBC:  Recent Labs Lab 06/21/13 0320  WBC 3.8*  HGB 8.5*  HCT 25.3*  MCV 83.8  PLT 293   Cardiac Enzymes:  Recent Labs Lab 06/20/13 1645 06/20/13 2154 06/21/13 0320  TROPONINI <0.30 <0.30 <0.30    Recent Results (from the past 240 hour(s))  MRSA PCR SCREENING     Status: None   Collection Time    06/20/13  1:34 PM      Result Value Range Status   MRSA by PCR NEGATIVE  NEGATIVE Final   Comment:            The GeneXpert MRSA Assay (FDA     approved for NASAL specimens     only), is one component of a     comprehensive MRSA colonization     surveillance program. It is not     intended to diagnose MRSA     infection nor to guide or      monitor treatment for     MRSA infections.      Studies/Results: No results found.  Medications:  Scheduled: . aspirin EC  81 mg Oral Daily  . ceFEPime (MAXIPIME) IV  1 g Intravenous Q8H  . enoxaparin (LOVENOX) injection  40 mg Subcutaneous Q24H  . fenofibrate  160 mg Oral Daily  . levofloxacin (LEVAQUIN) IV  750 mg Intravenous Q24H  . nicotine  14 mg Transdermal QODAY  . pneumococcal 23 valent vaccine  0.5 mL Intramuscular Tomorrow-1000  . predniSONE  60 mg Oral Daily  . simvastatin  20 mg Oral q1800  . sodium chloride  3 mL Intravenous Q12H  . vancomycin  500 mg Intravenous Q12H   Continuous:  NFA:[id number], acetaminophen, albuterol, clonazePAM, ondansetron (ZOFRAN) IV, ondansetron, oxyCODONE  Assessment/Plan:  Principal Problem:   HCAP (healthcare-associated pneumonia) Active Problems:   Squamous cell lung cancer   Rheumatoid arthritis   COPD (chronic obstructive pulmonary disease)   Coronary atherosclerosis of native coronary artery   Elevated troponin   Pneumonia involving left lung   Sepsis    Left lower lobe pneumonia with sepsis (CAP Vs HCAP) Considering her history of lung cancer and recent radiation treatments she was treated with broad-spectrum coverage with vancomycin and cefepime. Added Levaquin for atypicals. Sepsis seems to have resolved. Slow down IVF. She doesn't seem to require any oxygen at this time. Anticipate de-escalation of antibiotics 1/18.  Elevated troponin/likely demand ischemia Initial trop was 0.1 which was slightly higher than normal range at [facility]. Subsequent troponins have been normal. CP is pleuritic and likely from pneumonia. No need for further cardiac work up. Continue home medications. Recent cardiac catheterization report form October 2014 reviewed. She does have known CAD, however, is currently on medical management. She can follow up with Dr. Tamala Julian as OP. No arrythmias noted on Tele.  History of squamous cell lung  cancer She has completed radiation treatment. She has been seen by cardiothoracic surgery in past to consider surgery for her lung cancer. However, at the evaluation back in October, she was considered a poor candidate due to her poor general health. Hence radiation was initiated.    History of rheumatoid arthritis on chronic steroids Stable. Due to her sepsis we increased the dose of her prednisone. Since her blood pressure has stabilized with IV fluids no clear indication for intravenous stress dose steroids. Continue current dose of Prednisone.   History of COPD Nebulizer as needed. Smoking cessation emphasized. Continue nicotine patch.  Acute confusion Resolved. This was present yesterday and was likely  from hypoxia and low BP. She appears to be at baseline without focal deficits.   Anemia Hgb is lower than yesterday when it was 10. Dilution is contributing. She is asymptomatic and has no overt bleeding. Monitor for now.  DVT Prophylaxis: Lovenox  Code Status: Full code  Family Communication: Discussed with the patient  Disposition Plan: Transfer to floor. No need for tele. Anticipate discharge 1/19.    LOS: 1 day   [facility] Pager [phone] 06/21/2013, 9:20 AM  If 8PM-8AM, please contact night-coverage at [email or web address], password [email or web address]

## 2013-06-22 LAB — CBC
HCT: 29.1 % — ABNORMAL LOW (ref 36.0–46.0)
Hemoglobin: 9.7 g/dL — ABNORMAL LOW (ref 12.0–15.0)
MCH: 27.8 pg (ref 26.0–34.0)
MCHC: 33.3 g/dL (ref 30.0–36.0)
MCV: 83.4 fL (ref 78.0–100.0)
Platelets: 349 10*3/uL (ref 150–400)
RBC: 3.49 MIL/uL — ABNORMAL LOW (ref 3.87–5.11)
RDW: 15.1 % (ref 11.5–15.5)
WBC: 3.7 10*3/uL — AB (ref 4.0–10.5)

## 2013-06-22 LAB — BASIC METABOLIC PANEL
BUN: 7 mg/dL (ref 6–23)
CHLORIDE: 98 meq/L (ref 96–112)
CO2: 22 mEq/L (ref 19–32)
CREATININE: 0.53 mg/dL (ref 0.50–1.10)
Calcium: 8.4 mg/dL (ref 8.4–10.5)
Glucose, Bld: 147 mg/dL — ABNORMAL HIGH (ref 70–99)
Potassium: 3.2 mEq/L — ABNORMAL LOW (ref 3.7–5.3)
Sodium: 135 mEq/L — ABNORMAL LOW (ref 137–147)

## 2013-06-22 LAB — LEGIONELLA ANTIGEN, URINE: Legionella Antigen, Urine: NEGATIVE

## 2013-06-22 MED ORDER — LEVOFLOXACIN 750 MG PO TABS
750.0000 mg | ORAL_TABLET | Freq: Every day | ORAL | Status: DC
  Administered 2013-06-22 – 2013-06-23 (×2): 750 mg via ORAL
  Filled 2013-06-22 (×2): qty 1

## 2013-06-22 MED ORDER — MAGNESIUM HYDROXIDE 400 MG/5ML PO SUSP: 15.0000 mL | Freq: Every day | ORAL | Status: DC | PRN

## 2013-06-22 MED ORDER — PREDNISONE 20 MG PO TABS
40.0000 mg | ORAL_TABLET | Freq: Every day | ORAL | Status: DC
  Administered 2013-06-23: 40 mg via ORAL
  Filled 2013-06-22: qty 2

## 2013-06-22 MED ORDER — TRAZODONE HCL 50 MG PO TABS
50.0000 mg | ORAL_TABLET | Freq: Every evening | ORAL | Status: DC | PRN
  Administered 2013-06-22: 50 mg via ORAL
  Filled 2013-06-22: qty 1

## 2013-06-22 MED ORDER — NICOTINE 14 MG/24HR TD PT24
14.0000 mg | MEDICATED_PATCH | Freq: Every day | TRANSDERMAL | Status: DC
  Administered 2013-06-22 – 2013-06-23 (×2): 14 mg via TRANSDERMAL
  Filled 2013-06-22 (×2): qty 1

## 2013-06-22 MED ORDER — LOSARTAN POTASSIUM 50 MG PO TABS
50.0000 mg | ORAL_TABLET | Freq: Every day | ORAL | Status: DC
  Administered 2013-06-22 – 2013-06-23 (×2): 50 mg via ORAL
  Filled 2013-06-22 (×2): qty 1

## 2013-06-22 NOTE — Progress Notes (Signed)
[facility] PROGRESS NOTE  [name] [facility]:[id number] DOB: January 03, 1946 DOA: 06/20/2013  PCP: Leonides Sake, MD  Brief HPI: Olivia Nichols is a 68 y.o. female with a past medical history of a non-small cell lung cancer, COPD, coronary artery disease, on medical management, who was taken to the emergency room at [facility] 1/16 morning, as the patient was apparently confused. When EMS arrived they found her oxygen saturations to be about 89 percent and temperature was 100.8. She had been coughing starting a day earlier. She has had sharp, left-sided chest pain on and off. While she was evaluated in the local hospital she was found to have a mildly elevated troponin of 0.1. Due to her history of cardiac disease she was transferred over to [facility] for further management. Evaluation in the emergency department also revealed left lower lobe infiltrate.  Past medical history:  Past Medical History  Diagnosis Date  . Tobacco abuse     Current smoker  . COPD (chronic obstructive pulmonary disease)   . Neutropenia, unspecified   . Rheumatoid arthritis   . Osteoarthritis   . Cancer 09/12/12    Squamous cell lung cancer. RIGHT LOWER LOBE PNA.  [name] Hyperlipidemia     On pravastatin  . Essential hypertension, benign     On Losartan  . Coronary atherosclerosis of native coronary artery     CT angiography 08/2012 showed significantly coronary calcification. On ASA. Stess Test 03/06/13 LV EF = 68%. High risk study due to ischemic dilatation & mid to distal anterior ischemia.  [name] History of radiation therapy 05/19/13, 05/21/2013, 05/23/2013, 05/26/2013    SBRT right lower lung nodule, 50 gray    Consultants: None  Procedures: None  Antibiotics: Vanc 1/16-->1/18 Cefepime 1/16-->1/18 Levaquin 1/16-->  Subjective: Patient continues to improve. Chest pain has resolved. Cough is better. Had some difficulty sleeping.  Objective: Vital Signs  Filed Vitals:   06/21/13 1140 06/21/13 2300  06/22/13 0546 06/22/13 0632  BP: 154/57 167/66  160/90  Pulse: 72 75 88 82  Temp: 98.2 F (36.8 C) 98.2 F (36.8 C) 97.8 F (36.6 C)   TempSrc: Oral Oral Oral   Resp: 18 20 18    Height:      Weight:      SpO2: 100% 97% 97%     Intake/Output Summary (Last 24 hours) at 06/22/13 1154 Last data filed at 06/22/13 1057  Gross per 24 hour  Intake    390 ml  Output      0 ml  Net    390 ml   Filed Weights   06/20/13 1330 06/21/13 0408  Weight: 60.2 kg (132 lb 11.5 oz) 62 kg (136 lb 11 oz)   General appearance: alert, cooperative, appears stated age and no distress Resp: Crackles left base. No wheezing. Cardio: regular rate and rhythm, S1, S2 normal, no murmur, click, rub or gallop GI: soft, non-tender; bowel sounds normal; no masses,  no organomegaly Extremities: extremities normal, atraumatic, no cyanosis or edema Neurologic: No focal deficits  Lab Results:  Basic Metabolic Panel:  Recent Labs Lab 06/21/13 0320  NA 135*  K 4.1  CL 103  CO2 21  GLUCOSE 135*  BUN 11  CREATININE 0.60  CALCIUM 8.0*   Liver Function Tests:  Recent Labs Lab 06/21/13 0320  AST 11  ALT 8  ALKPHOS 70  BILITOT 0.3  PROT 5.8*  ALBUMIN 2.2*   CBC:  Recent Labs Lab 06/21/13 0320  WBC 3.8*  HGB 8.5*  HCT 25.3*  MCV 83.8  PLT 293   Cardiac Enzymes:  Recent Labs Lab 06/20/13 1645 06/20/13 2154 06/21/13 0320  TROPONINI <0.30 <0.30 <0.30    Recent Results (from the past 240 hour(s))  MRSA PCR SCREENING     Status: None   Collection Time    06/20/13  1:34 PM      Result Value Range Status   MRSA by PCR NEGATIVE  NEGATIVE Final   Comment:            The GeneXpert MRSA Assay (FDA     approved for NASAL specimens     only), is one component of a     comprehensive MRSA colonization     surveillance program. It is not     intended to diagnose MRSA     infection nor to guide or     monitor treatment for     MRSA infections.  CULTURE, BLOOD (ROUTINE X 2)     Status:  None   Collection Time    06/20/13  3:00 PM      Result Value Range Status   Specimen Description BLOOD ARM RIGHT   Final   Special Requests     Final   Value: BOTTLES DRAWN AEROBIC AND ANAEROBIC BLUE 10CC RED 5CC   Culture  Setup Time     Final   Value: 06/20/2013 22:59     Performed at [facility]   Culture     Final   Value:        BLOOD CULTURE RECEIVED NO GROWTH TO DATE CULTURE WILL BE HELD FOR 5 DAYS BEFORE ISSUING A FINAL NEGATIVE REPORT     Performed at [facility]   Report Status PENDING   Incomplete  CULTURE, BLOOD (ROUTINE X 2)     Status: None   Collection Time    06/20/13  3:15 PM      Result Value Range Status   Specimen Description BLOOD ARM LEFT   Final   Special Requests BOTTLES DRAWN AEROBIC ONLY 10CC   Final   Culture  Setup Time     Final   Value: 06/20/2013 22:59     Performed at [facility]   Culture     Final   Value:        BLOOD CULTURE RECEIVED NO GROWTH TO DATE CULTURE WILL BE HELD FOR 5 DAYS BEFORE ISSUING A FINAL NEGATIVE REPORT     Performed at [facility]   Report Status PENDING   Incomplete      Studies/Results: No results found.  Medications:  Scheduled: . aspirin EC  81 mg Oral Daily  . enoxaparin (LOVENOX) injection  40 mg Subcutaneous Q24H  . fenofibrate  160 mg Oral Daily  . levofloxacin  750 mg Oral Daily  . losartan  50 mg Oral Daily  . nicotine  14 mg Transdermal Daily  . pneumococcal 23 valent vaccine  0.5 mL Intramuscular Tomorrow-1000  . predniSONE  60 mg Oral Daily  . simvastatin  20 mg Oral q1800  . sodium chloride  3 mL Intravenous Q12H   Continuous:  VQM:[id number], acetaminophen, albuterol, clonazePAM, magnesium hydroxide, ondansetron (ZOFRAN) IV, ondansetron, oxyCODONE, traZODone  Assessment/Plan:  Principal Problem:   HCAP (healthcare-associated pneumonia) Active Problems:   Squamous cell lung cancer   Rheumatoid arthritis   COPD (chronic obstructive pulmonary disease)    Coronary atherosclerosis of native coronary artery   Elevated troponin   Pneumonia involving left lung   Sepsis  Left lower lobe pneumonia with sepsis (CAP Vs HCAP) Considering her history of lung cancer and recent radiation treatments she was treated with broad-spectrum coverage with vancomycin and cefepime. Added Levaquin for atypicals. Sepsis seems to have resolved. Change to oral levaquin and stop vanc and cefepime.   Elevated troponin/likely demand ischemia Initial trop was 0.1 which was slightly higher than normal range at [facility]. Subsequent troponins have been normal. CP is pleuritic and likely from pneumonia. No need for further cardiac work up. Continue home medications. Recent cardiac catheterization report form October 2014 reviewed. She does have known CAD, however, is currently on medical management. She can follow up with Dr. Tamala Julian as OP. No arrythmias noted on Tele.  History of squamous cell lung cancer She has completed radiation treatment. She has been seen by cardiothoracic surgery in past to consider surgery for her lung cancer. However, at the evaluation back in October, she was considered a poor candidate due to her poor general health. Hence radiation was initiated.   History of rheumatoid arthritis on chronic steroids Stable. Due to her sepsis we increased the dose of her prednisone. Since her blood pressure has stabilized with IV fluids no clear indication for intravenous stress dose steroids. Start tapering. Could be contributing to elevated BP.  History of COPD Nebulizer as needed. Smoking cessation emphasized. Continue nicotine patch.  Acute confusion Resolved. This was present at admission and was likely from hypoxia and low BP. She appears to be at baseline without focal deficits.   Anemia Hgb is lower than yesterday when it was 10. Dilution is contributing. She is asymptomatic and has no overt bleeding. Monitor for now. Check today.  DVT Prophylaxis:  Lovenox  Code Status: Full code  Family Communication: Discussed with the patient and daughter Disposition Plan: Anticipate discharge 1/19.    LOS: 2 days   [facility] Pager [phone] 06/22/2013, 11:54 AM  If 8PM-8AM, please contact night-coverage at [email or web address], password [facility]

## 2013-06-22 NOTE — Progress Notes (Signed)
MD, patient would like to resume her blood pressure medication Losartan 50 mg PO daily.

## 2013-06-23 ENCOUNTER — Ambulatory Visit: Payer: Medicare Other | Admitting: Radiation Oncology

## 2013-06-23 MED ORDER — POTASSIUM CHLORIDE CRYS ER 20 MEQ PO TBCR
40.0000 meq | EXTENDED_RELEASE_TABLET | Freq: Once | ORAL | Status: AC
  Administered 2013-06-23: 40 meq via ORAL
  Filled 2013-06-23: qty 2

## 2013-06-23 MED ORDER — PREDNISONE 10 MG PO TABS: ORAL_TABLET | ORAL | Status: DC

## 2013-06-23 MED ORDER — LOSARTAN POTASSIUM 50 MG PO TABS: 100.0000 mg | ORAL_TABLET | Freq: Every day | ORAL | Status: DC

## 2013-06-23 MED ORDER — LEVOFLOXACIN 750 MG PO TABS: 750.0000 mg | ORAL_TABLET | Freq: Every day | ORAL | Status: DC

## 2013-06-23 MED ORDER — SODIUM CHLORIDE 0.9 % IV SOLN: INTRAVENOUS | Status: DC

## 2013-06-23 NOTE — Discharge Summary (Signed)
[facility]  Physician Discharge Summary   Patient ID: Olivia Nichols MRN: [id number] DOB/AGE: 1945/12/10 68 y.o.  Admit date: 06/20/2013 Discharge date: 06/23/2013  PCP: Leonides Sake, MD  DISCHARGE DIAGNOSES:  Principal Problem:   HCAP (healthcare-associated pneumonia) Active Problems:   Squamous cell lung cancer   Rheumatoid arthritis   COPD (chronic obstructive pulmonary disease)   Coronary atherosclerosis of native coronary artery   Pneumonia involving left lung   RECOMMENDATIONS FOR OUTPATIENT FOLLOW UP: 1. Needs close follow up with PCP 2. Dose of Cozaar increased for better BP control.  DISCHARGE CONDITION: fair  Diet recommendation: Low Sodium  Filed Weights   06/20/13 1330 06/21/13 0408  Weight: 60.2 kg (132 lb 11.5 oz) 62 kg (136 lb 11 oz)    INITIAL HISTORY: Olivia Nichols is a 68 y.o. female with a past medical history of a non-small cell lung cancer, COPD, coronary artery disease, on medical management, who was taken to the emergency room at [facility] 1/16 morning, as the patient was apparently confused. When EMS arrived they found her oxygen saturations to be about 89 percent and temperature was 100.8. She had been coughing starting a day earlier. She has had sharp, left-sided chest pain on and off. While she was evaluated in the local hospital she was found to have a mildly elevated troponin of 0.1. Due to her history of cardiac disease she was transferred over to [facility] for further management. Evaluation in the emergency department also revealed left lower lobe infiltrate.  Consultations:  None  Procedures:  None  HOSPITAL COURSE:   Left lower lobe pneumonia with sepsis (CAP Vs HCAP)  Considering her history of lung cancer and recent radiation treatments she was treated with broad-spectrum coverage with vancomycin and cefepime. Added Levaquin for atypicals. Sepsis seems to have resolved. She was changed to oral levaquin alone as  cultures were all negative. Symptomatically as well she as improved and ambulating with no difficulty.   Elevated troponin/likely demand ischemia  Initial trop was 0.1 which was slightly higher than normal range at [facility]. Subsequent troponins have been normal. CP is pleuritic and likely from pneumonia. No need for further cardiac work up. Continue home medications. Recent cardiac catheterization report form October 2014 reviewed. She does have known CAD, however, is currently on medical management. She can follow up with Dr. Tamala Julian as OP. No arrythmias noted on Tele. Her BP is poorly controlled and so her Cozaar will be increased in dose.  History of squamous cell lung cancer  She has completed radiation treatment. She has been seen by cardiothoracic surgery in past to consider surgery for her lung cancer. However, at the evaluation back in October, she was considered a poor candidate due to her poor general health. Hence radiation was initiated. She may follow up with her providers at cancer center.  History of rheumatoid arthritis on chronic steroids  This is stable. Due to her sepsis we increased the dose of her prednisone. Since her blood pressure stabilized with IV fluids there was no clear indication for intravenous stress dose steroids. Start tapering at home back to usual home dose.   History of COPD  Smoking cessation reemphasized. Continue home meds.  Acute confusion  Resolved. This was present at admission and was likely from hypoxia and low BP. She appears to be at baseline without focal deficits.   Anemia  Hgb is low but stable. Can be pursued as OP. No overt bleeding.   Overall she is improved. She  is ok for discharge. Patient keen on going home.   PERTINENT LABS:  The results of significant diagnostics from this hospitalization (including imaging, microbiology, ancillary and laboratory) are listed below for reference.    Microbiology: Recent Results (from the past 240  hour(s))  MRSA PCR SCREENING     Status: None   Collection Time    06/20/13  1:34 PM      Result Value Range Status   MRSA by PCR NEGATIVE  NEGATIVE Final   Comment:            The GeneXpert MRSA Assay (FDA     approved for NASAL specimens     only), is one component of a     comprehensive MRSA colonization     surveillance program. It is not     intended to diagnose MRSA     infection nor to guide or     monitor treatment for     MRSA infections.  CULTURE, BLOOD (ROUTINE X 2)     Status: None   Collection Time    06/20/13  3:00 PM      Result Value Range Status   Specimen Description BLOOD ARM RIGHT   Final   Special Requests     Final   Value: BOTTLES DRAWN AEROBIC AND ANAEROBIC BLUE 10CC RED 5CC   Culture  Setup Time     Final   Value: 06/20/2013 22:59     Performed at [facility]   Culture     Final   Value:        BLOOD CULTURE RECEIVED NO GROWTH TO DATE CULTURE WILL BE HELD FOR 5 DAYS BEFORE ISSUING A FINAL NEGATIVE REPORT     Performed at [facility]   Report Status PENDING   Incomplete  CULTURE, BLOOD (ROUTINE X 2)     Status: None   Collection Time    06/20/13  3:15 PM      Result Value Range Status   Specimen Description BLOOD ARM LEFT   Final   Special Requests BOTTLES DRAWN AEROBIC ONLY 10CC   Final   Culture  Setup Time     Final   Value: 06/20/2013 22:59     Performed at [facility]   Culture     Final   Value:        BLOOD CULTURE RECEIVED NO GROWTH TO DATE CULTURE WILL BE HELD FOR 5 DAYS BEFORE ISSUING A FINAL NEGATIVE REPORT     Performed at [facility]   Report Status PENDING   Incomplete     Labs: Basic Metabolic Panel:  Recent Labs Lab 06/21/13 0320 06/22/13 1247  NA 135* 135*  K 4.1 3.2*  CL 103 98  CO2 21 22  GLUCOSE 135* 147*  BUN 11 7  CREATININE 0.60 0.53  CALCIUM 8.0* 8.4   Liver Function Tests:  Recent Labs Lab 06/21/13 0320  AST 11  ALT 8  ALKPHOS 70  BILITOT 0.3  PROT 5.8*  ALBUMIN  2.2*   CBC:  Recent Labs Lab 06/21/13 0320 06/22/13 1247  WBC 3.8* 3.7*  HGB 8.5* 9.7*  HCT 25.3* 29.1*  MCV 83.8 83.4  PLT 293 349   Cardiac Enzymes:  Recent Labs Lab 06/20/13 1645 06/20/13 2154 06/21/13 0320  TROPONINI <0.30 <0.30 <0.30   IMAGING STUDIES No results found.  DISCHARGE EXAMINATION: Filed Vitals:   06/22/13 1430 06/22/13 1547 06/22/13 2022 06/23/13 0612  BP: 167/69  149/59 171/59  Pulse: 80  72 79  Temp:  98.5 F (36.9 C) 98 F (36.7 C) 98.4 F (36.9 C)  TempSrc:      Resp:      Height:      Weight:      SpO2:  98% 98% 95%   General appearance: alert, cooperative, appears stated age and no distress Resp: few scattered crackles. No wheezing. Improved air entry Cardio: regular rate and rhythm, S1, S2 normal, no murmur, click, rub or gallop Extremities: extremities normal, atraumatic, no cyanosis or edema Neurologic: Alert and oriented X 3, normal strength and tone. Normal symmetric reflexes. Normal coordination and gait  DISPOSITION: Home with daughter  Discharge Orders   Future Orders Complete By Expires   Diet - low sodium heart healthy  As directed    Discharge instructions  As directed    Comments:     The dose of your BP medication was increased as your BP has been running high. Please follow up with your PCP for further management.   Increase activity slowly  As directed       ALLERGIES:  Allergies  Allergen Reactions  . Codeine Other (See Comments)    Passes out  . Morphine And Related Other (See Comments)    Patient assumes she is allergic to morphine because she is allergic to codeine    Current Discharge Medication List    START taking these medications   Details  levofloxacin (LEVAQUIN) 750 MG tablet Take 1 tablet (750 mg total) by mouth daily. For 5 more days Qty: 5 tablet, Refills: 0      CONTINUE these medications which have CHANGED   Details  losartan (COZAAR) 50 MG tablet Take 2 tablets (100 mg total) by mouth  daily. Qty: 60 tablet, Refills: 1    predniSONE (DELTASONE) 10 MG tablet Take 4 tabs once daily for 3 days, then take 2 tabs once daily for 3 days, then take 1 tab once daily for 3 days and then 5mg  once daily as before. Qty: 21 tablet, Refills: 0      CONTINUE these medications which have NOT CHANGED   Details  albuterol (PROVENTIL HFA;VENTOLIN HFA) 108 (90 BASE) MCG/ACT inhaler Inhale 2 puffs into the lungs every 4 (four) hours as needed for wheezing.    aspirin 325 MG tablet Take 325-650 mg by mouth daily. For pain    clonazePAM (KLONOPIN) 1 MG tablet Take 1 mg by mouth 2 (two) times daily as needed for anxiety.    fenofibrate 160 MG tablet Take 160 mg by mouth daily.    hyaluronate sodium (RADIAPLEXRX) GEL Apply 1 application topically 2 (two) times daily as needed (pain).     nicotine (NICODERM CQ - DOSED IN MG/24 HOURS) 14 mg/24hr patch Place 14 mg onto the skin every other day.    pravastatin (PRAVACHOL) 40 MG tablet Take 40 mg by mouth at bedtime.    traMADol (ULTRAM) 50 MG tablet Take 50 mg by mouth every 6 (six) hours as needed for pain.    traZODone (DESYREL) 50 MG tablet Take 50 mg by mouth at bedtime.        Follow-up Information   Follow up with [facility], MD. Schedule an appointment as soon as possible for a visit in 1 week.   Specialty:  Family Medicine   Contact information:   Dr. Daiva Eves [address] [place] [address] [phone]       TOTAL DISCHARGE TIME: 35 mins  [facility]  [facility] Pager [phone]  06/23/2013, 1:10 PM

## 2013-06-23 NOTE — Evaluation (Signed)
Occupational Therapy Evaluation Patient Details Name: Olivia Nichols MRN: [id number] DOB: 10/09/1945 Today's Date: 06/23/2013 Time: 4696-2952 OT Time Calculation (min): 20 min  OT Assessment / Plan / Recommendation History of present illness Olivia Nichols is a 68 y.o. female with a past medical history of a non-small cell lung cancer, COPD, coronary artery disease, on medical management, who was taken to the emergency room at [facility] this morning, as the patient was apparently confused. [name] PNA   Clinical Impression   Pt is Mod I with ADLs and ADL mobility and no further acute OT services indicated at this time, OT will sign off    OT Assessment  Patient does not need any further OT services    Follow Up Recommendations  No OT follow up    Barriers to Discharge  none    Equipment Recommendations  None recommended by OT    Recommendations for Other Services    Frequency       Precautions / Restrictions  none   Pertinent Vitals/Pain No c/o pain    ADL  Grooming: Performed;Wash/dry hands;Wash/dry face;Modified independent Upper Body Bathing: Simulated;Modified independent Lower Body Bathing: Simulated;Modified independent Upper Body Dressing: Performed;Modified independent Lower Body Dressing: Performed;Modified independent Toilet Transfer: Performed;Modified independent Science writer: Regular height toilet;Grab bars Toileting - Clothing Manipulation and Hygiene: Performed;Modified independent Where Assessed - Toileting Clothing Manipulation and Hygiene: Standing Tub/Shower Transfer: Performed;Modified independent Tub/Shower Transfer Method: Therapist, art: Grab bars;Walk in shower Equipment Used: Gait belt    OT Diagnosis:    OT Problem List:   OT Treatment Interventions:     OT Goals(Current goals can be found in the care plan section)    Visit Information  Last OT  Received On: 06/23/13 History of Present Illness: Olivia Nichols is a 68 y.o. female with a past medical history of a non-small cell lung cancer, COPD, coronary artery disease, on medical management, who was taken to the emergency room at [facility] this morning, as the patient was apparently confused. [name] PNA       Prior [place] expects to be discharged to:: Private residence Living Arrangements: Children Available Help at Discharge: Family;Available 24 hours/day Type of Home: House Home Access: Stairs to enter [employer] of Steps: 1 Home Layout: One level Home Equipment: None Communication Communication: No difficulties Dominant Hand: Right         Vision/Perception Vision - History Baseline Vision: Wears glasses only for reading Patient Visual Report: No change from baseline Perception Perception: Within Functional Limits   Cognition  Cognition Arousal/Alertness: Awake/alert Behavior During Therapy: WFL for tasks assessed/performed Overall Cognitive Status: Within Functional Limits for tasks assessed    Extremity/Trunk Assessment Upper Extremity Assessment Upper Extremity Assessment: Defer to OT evaluation Lower Extremity Assessment Lower Extremity Assessment: Defer to PT evaluation Cervical / Trunk Assessment Cervical / Trunk Assessment: Normal     Mobility Bed Mobility Overal bed mobility: Modified Independent Transfers Overall transfer level: Modified independent     Exercise     Balance Balance Overall balance assessment: No apparent balance deficits (not formally assessed)   End of Session OT - End of Session Equipment Utilized During Treatment: Gait belt Activity Tolerance: Patient tolerated treatment well Patient left: in chair;with call bell/phone within reach;with family/visitor present  GO     Britt Bottom 06/23/2013, 12:56  PM

## 2013-06-26 LAB — CULTURE, BLOOD (ROUTINE X 2)
CULTURE: NO GROWTH
CULTURE: NO GROWTH

## 2013-07-17 ENCOUNTER — Ambulatory Visit
Admission: RE | Admit: 2013-07-17 | Discharge: 2013-07-17 | Disposition: A | Discharge status: Home / Self Care | Payer: Medicare Other | Source: Ambulatory Visit | Attending: Radiation Oncology | Admitting: Radiation Oncology

## 2013-07-17 ENCOUNTER — Encounter: Payer: Self-pay | Admitting: Radiation Oncology

## 2013-07-17 VITALS — BP 144/58 | HR 78 | Temp 98.0°F | Ht 68.0 in | Wt 133.2 lb

## 2013-07-17 DIAGNOSIS — C3490 Malignant neoplasm of unspecified part of unspecified bronchus or lung: Secondary | ICD-10-CM

## 2013-07-17 NOTE — Progress Notes (Signed)
Radiation Oncology         [phone] ________________________________  Name: Olivia Nichols MRN: [id number]  Date: 07/17/2013  DOB: [birth year]  Follow-Up Visit Note  CC: Leonides Sake, MD  Hamrick, Maura L, MD  Diagnosis:   Clinical stage I non-small cell lung cancer presenting in the right lower lung field   Interval Since Last Radiation:  6  weeks  Narrative:  The patient returns today for routine follow-up.  Interval history is significant for the patient being admitted for left lower lobe pneumonia. She remained in hospital for approximately 5 days. She continues to have some fatigue but is making recovery at this time. She denies any hemoptysis or pain within the chest area. She has a mild nonproductive cough. She has completed outpatient antibiotics. She denies any chills or fever.                              ALLERGIES:  is allergic to codeine and morphine and related.  Meds: Current Outpatient Prescriptions  Medication Sig Dispense Refill  . aspirin 325 MG tablet Take 325-650 mg by mouth daily. For pain      . clonazePAM (KLONOPIN) 1 MG tablet Take 1 mg by mouth 2 (two) times daily as needed for anxiety.      . fenofibrate 160 MG tablet Take 160 mg by mouth daily.      [name] losartan (COZAAR) 50 MG tablet Take 2 tablets (100 mg total) by mouth daily.  60 tablet  1  . nicotine (NICODERM CQ - DOSED IN MG/24 HOURS) 14 mg/24hr patch Place 14 mg onto the skin every other day.      . pravastatin (PRAVACHOL) 40 MG tablet Take 40 mg by mouth at bedtime.      . traMADol (ULTRAM) 50 MG tablet Take 50 mg by mouth every 6 (six) hours as needed for pain.      . traZODone (DESYREL) 50 MG tablet Take 50 mg by mouth at bedtime.       [name] acetaminophen (RA ACETAMINOPHEN) 650 MG CR tablet Take 650 mg by mouth.      [name] albuterol (PROVENTIL HFA;VENTOLIN HFA) 108 (90 BASE) MCG/ACT inhaler Inhale 2 puffs into the lungs every 4 (four) hours as needed for wheezing.      . Cefepime-Dextrose 2 GM/50ML  SOLR Inject into the vein.      . hyaluronate sodium (RADIAPLEXRX) GEL Apply 1 application topically 2 (two) times daily as needed (pain).       . predniSONE (DELTASONE) 10 MG tablet Take 4 tabs once daily for 3 days, then take 2 tabs once daily for 3 days, then take 1 tab once daily for 3 days and then 5mg  once daily as before.  21 tablet  0  . promethazine (PHENERGAN) 6.25 MG/5ML syrup Take by mouth.      . [DISCONTINUED] lisinopril (PRINIVIL,ZESTRIL) 20 MG tablet Take 20 mg by mouth daily.       No current facility-administered medications for this encounter.    Physical Findings: The patient is in no acute distress. Patient is alert and oriented.  height is 5\' 8"  (1.727 m) and weight is 133 lb 3.2 oz (60.419 kg). Her temperature is 98 F (36.7 C). Her blood pressure is 144/58 and her pulse is 78. Her oxygen saturation is 97%. .  No palpable supraclavicular or axillary adenopathy. The lungs are clear except for some mild bibasilar crackles.  The  heart has a regular rhythm and rate.  Lab Findings: Lab Results  Component Value Date   WBC 3.7* 06/22/2013   HGB 9.7* 06/22/2013   HCT 29.1* 06/22/2013   MCV 83.4 06/22/2013   PLT 349 06/22/2013      Radiographic Findings: No results found.  Impression:   Clinical stage I non-small cell lung cancer presenting in the right lower lung field.  Status post SBRT.  Plan:  She will return the in June. The patient will undergo a chest CT scan at that time if not already ordered  ____________________________________ Blair Promise, MD

## 2013-07-17 NOTE — Progress Notes (Signed)
Olivia Nichols here for follow up after SBRT to her right lower lung.  She denies pain.  She reports a frequent, productive cough.  She is brining up yellow sputum.  She reports fatigue.  She denies hemoptysis and shortness of breath with activity.  She reports that her skin in the treatment area is intact.  She was recently hospitalized for pneumonia and was discharged 2 weeks ago.

## 2013-09-12 ENCOUNTER — Encounter: Payer: Self-pay | Admitting: *Deleted

## 2013-09-12 NOTE — [facility] Oncology Navigator Note (Signed)
I called patient to check in.  Patient's daughter, Olivia Nichols, said she was taking a nap and could not come to the phone.  She reports that her mom was admitted to the hospital in February for a low wbc.  She was discharged home with oxygen to use as needed.  Patient does not need it at home during usual activities but needs it when walking long distances or working in the yard.  Patient is also seeing her PCP for "shots for her wbc".  She reports that patient is doing fairly well.  We reviewed her upcoming appointments.  She denied any questions or concerns at this time.  I verified that she had my contact information and encouraged her to call me for any needs.

## 2013-11-13 ENCOUNTER — Telehealth: Payer: Self-pay | Admitting: *Deleted

## 2013-11-13 ENCOUNTER — Ambulatory Visit
Admission: RE | Admit: 2013-11-13 | Discharge: 2013-11-13 | Disposition: A | Discharge status: Home / Self Care | Payer: Medicare Other | Source: Ambulatory Visit | Attending: Radiation Oncology | Admitting: Radiation Oncology

## 2013-11-13 ENCOUNTER — Ambulatory Visit (HOSPITAL_COMMUNITY): Payer: Medicare Other

## 2013-11-13 ENCOUNTER — Encounter: Payer: Self-pay | Admitting: Radiation Oncology

## 2013-11-13 VITALS — BP 109/61 | HR 102 | Temp 98.7°F | Resp 28 | Ht 68.0 in | Wt 137.6 lb

## 2013-11-13 DIAGNOSIS — C3490 Malignant neoplasm of unspecified part of unspecified bronchus or lung: Secondary | ICD-10-CM

## 2013-11-13 DIAGNOSIS — Z885 Allergy status to narcotic agent status: Secondary | ICD-10-CM | POA: Insufficient documentation

## 2013-11-13 DIAGNOSIS — Z79899 Other long term (current) drug therapy: Secondary | ICD-10-CM | POA: Insufficient documentation

## 2013-11-13 DIAGNOSIS — C3430 Malignant neoplasm of lower lobe, unspecified bronchus or lung: Secondary | ICD-10-CM | POA: Insufficient documentation

## 2013-11-13 DIAGNOSIS — Z7982 Long term (current) use of aspirin: Secondary | ICD-10-CM | POA: Insufficient documentation

## 2013-11-13 DIAGNOSIS — IMO0002 Reserved for concepts with insufficient information to code with codable children: Secondary | ICD-10-CM | POA: Insufficient documentation

## 2013-11-13 LAB — BASIC METABOLIC PANEL (CC13)
Anion Gap: 11 mEq/L (ref 3–11)
BUN: 15.8 mg/dL (ref 7.0–26.0)
CHLORIDE: 93 meq/L — AB (ref 98–109)
CO2: 24 meq/L (ref 22–29)
Calcium: 9.3 mg/dL (ref 8.4–10.4)
Creatinine: 1.1 mg/dL (ref 0.6–1.1)
Glucose: 139 mg/dl (ref 70–140)
POTASSIUM: 4.4 meq/L (ref 3.5–5.1)
SODIUM: 127 meq/L — AB (ref 136–145)

## 2013-11-13 LAB — CBC WITH DIFFERENTIAL/PLATELET
BASO%: 0.1 % (ref 0.0–2.0)
Basophils Absolute: 0 10*3/uL (ref 0.0–0.1)
EOS%: 0.1 % (ref 0.0–7.0)
Eosinophils Absolute: 0 10*3/uL (ref 0.0–0.5)
HEMATOCRIT: 29.7 % — AB (ref 34.8–46.6)
HGB: 10.1 g/dL — ABNORMAL LOW (ref 11.6–15.9)
LYMPH#: 0.2 10*3/uL — AB (ref 0.9–3.3)
LYMPH%: 11.4 % — ABNORMAL LOW (ref 14.0–49.7)
MCH: 28.6 pg (ref 25.1–34.0)
MCHC: 34.1 g/dL (ref 31.5–36.0)
MCV: 84 fL (ref 79.5–101.0)
MONO#: 0.2 10*3/uL (ref 0.1–0.9)
MONO%: 8.5 % (ref 0.0–14.0)
NEUT#: 1.6 10*3/uL (ref 1.5–6.5)
NEUT%: 79.9 % — AB (ref 38.4–76.8)
Platelets: 341 10*3/uL (ref 145–400)
RBC: 3.53 10*6/uL — AB (ref 3.70–5.45)
RDW: 16.2 % — AB (ref 11.2–14.5)
WBC: 2 10*3/uL — AB (ref 3.9–10.3)

## 2013-11-13 NOTE — Progress Notes (Signed)
Radiation Oncology         [phone] ________________________________  Name: Olivia Nichols MRN: [id number]  Date: 11/13/2013  DOB: 09/10/1945  Follow-Up Visit Note  CC: Leonides Sake, MD  Marice Potter, MD  Diagnosis:   Clinical stage I non-small cell lung cancer presenting in the right lower lung field  Interval Since Last Radiation:  6  months  Narrative:  The patient returns today for routine follow-up.  She has had some lower back pain and is seeing her primary care physician concerning this issue. She was admitted to the hospital at some point and has been discharged supplemental oxygen which she uses with walking. Patient denies any pain in the chest area significant cough or hemoptysis.                              ALLERGIES:  is allergic to codeine; morphine and related; tramadol; and trazodone and nefazodone.  Meds: Current Outpatient Prescriptions  Medication Sig Dispense Refill  . albuterol (PROAIR HFA) 108 (90 BASE) MCG/ACT inhaler Inhale into the lungs.      [name] albuterol (PROVENTIL HFA;VENTOLIN HFA) 108 (90 BASE) MCG/ACT inhaler Inhale 2 puffs into the lungs every 4 (four) hours as needed for wheezing.      [name] aspirin 325 MG tablet Take 325-650 mg by mouth daily. For pain      . clonazePAM (KLONOPIN) 1 MG tablet Take 1 mg by mouth 2 (two) times daily as needed for anxiety.      . fenofibrate 160 MG tablet Take 160 mg by mouth daily.      [name] losartan (COZAAR) 50 MG tablet Take 2 tablets (100 mg total) by mouth daily.  60 tablet  1  . moxifloxacin (AVELOX) 400 MG tablet Take 400 mg by mouth daily at 8 pm.      . nicotine (NICODERM CQ - DOSED IN MG/24 HOURS) 14 mg/24hr patch Place 14 mg onto the skin every other day.      [name] omeprazole (PRILOSEC) 40 MG capsule Take 40 mg by mouth daily.      . pravastatin (PRAVACHOL) 40 MG tablet Take 40 mg by mouth at bedtime.      . predniSONE (DELTASONE) 10 MG tablet Take 4 tabs once daily for 3 days, then take 2 tabs once daily for  3 days, then take 1 tab once daily for 3 days and then 5mg  once daily as before.  21 tablet  0  . promethazine (PHENERGAN) 6.25 MG/5ML syrup Take by mouth.      [name] acetaminophen (RA ACETAMINOPHEN) 650 MG CR tablet Take 650 mg by mouth.      . Cefepime-Dextrose 2 GM/50ML SOLR Inject into the vein.      . hyaluronate sodium (RADIAPLEXRX) GEL Apply 1 application topically 2 (two) times daily as needed (pain).       . traZODone (DESYREL) 50 MG tablet Take 50 mg by mouth at bedtime.       . [DISCONTINUED] lisinopril (PRINIVIL,ZESTRIL) 20 MG tablet Take 20 mg by mouth daily.       No current facility-administered medications for this encounter.    Physical Findings: The patient is in no acute distress. Patient is alert and oriented.  height is 5\' 8"  (1.727 m) and weight is 137 lb 9.6 oz (62.415 kg). Her oral temperature is 98.7 F (37.1 C). Her blood pressure is 109/61 and her pulse is 102. Her respiration is  28 and oxygen saturation is 91%. .  No palpable supraclavicular or axillary adenopathy. The lung exam shows bibasilar crackles. The heart has a regular rhythm and rate.  the lower motor strength is 5 out of 5 in the proximal and distal muscle groups. Patient appears to have a skin cancer along the left neck and I recommended she followup with her primary care physician concerning this issue.  Lab Findings: Lab Results  Component Value Date   WBC 3.7* 06/22/2013   HGB 9.7* 06/22/2013   HCT 29.1* 06/22/2013   MCV 83.4 06/22/2013   PLT 349 06/22/2013      Radiographic Findings: No results found.  Impression:  Clinical stage I non-small cell lung cancer status post SBRT  Plan:  Routine lab work and CT scan of the chest. Followup in 6 months concerning the patient's lung cancer  ____________________________________ Blair Promise, MD

## 2013-11-13 NOTE — Progress Notes (Signed)
Olivia Nichols here for follow up after treatment to her right lower lung.  She reports pain in her stomach and her lower back.  She said she was bending over and pulled a muscle in her back.  She is taking ibuprofen.  The back pain has affected her mobility and she is not walking as much.  She saw Dr. Ovid Curd yesterday and he said she has pneumonia and put her on prednisone and moxifloxacin.  She reports shortness of breath with activity and uses 2.5-3L of oxygen at home.  She was 91% on room air today.  She reports a productive cough with brown sputum.  She denies a sore throat.  Her daughters report that she is weak and has problems with balance.  She has a wound on her left arm from falling into a door and also a wound on her left foot from dropping a pan on it.  This wound is red and scabbed.  The skin around the wound is red and swollen.

## 2013-11-13 NOTE — Telephone Encounter (Signed)
Called patient to inform of test, lvm for a return call

## 2013-11-20 ENCOUNTER — Encounter (HOSPITAL_COMMUNITY): Payer: Self-pay

## 2013-11-20 ENCOUNTER — Ambulatory Visit (HOSPITAL_COMMUNITY)
Admission: RE | Admit: 2013-11-20 | Discharge: 2013-11-20 | Disposition: A | Discharge status: Home / Self Care | Payer: Medicare Other | Source: Ambulatory Visit | Attending: Radiation Oncology | Admitting: Radiation Oncology

## 2013-11-20 DIAGNOSIS — J984 Other disorders of lung: Secondary | ICD-10-CM | POA: Insufficient documentation

## 2013-11-20 DIAGNOSIS — C3490 Malignant neoplasm of unspecified part of unspecified bronchus or lung: Secondary | ICD-10-CM | POA: Insufficient documentation

## 2013-11-20 DIAGNOSIS — I2510 Atherosclerotic heart disease of native coronary artery without angina pectoris: Secondary | ICD-10-CM | POA: Insufficient documentation

## 2013-11-20 DIAGNOSIS — K449 Diaphragmatic hernia without obstruction or gangrene: Secondary | ICD-10-CM | POA: Insufficient documentation

## 2013-11-20 DIAGNOSIS — J8410 Pulmonary fibrosis, unspecified: Secondary | ICD-10-CM | POA: Insufficient documentation

## 2013-11-20 MED ORDER — IOHEXOL 300 MG/ML  SOLN
80.0000 mL | Freq: Once | INTRAMUSCULAR | Status: AC | PRN
  Administered 2013-11-20: 80 mL via INTRAVENOUS

## 2013-12-10 ENCOUNTER — Ambulatory Visit
Admission: RE | Admit: 2013-12-10 | Discharge: 2013-12-10 | Disposition: A | Discharge status: Home / Self Care | Payer: Medicare Other | Source: Ambulatory Visit | Attending: Radiation Oncology | Admitting: Radiation Oncology

## 2013-12-10 ENCOUNTER — Encounter: Payer: Self-pay | Admitting: Radiation Oncology

## 2013-12-10 VITALS — BP 107/61 | HR 101 | Temp 98.0°F | Ht 68.0 in | Wt 128.6 lb

## 2013-12-10 DIAGNOSIS — Z79899 Other long term (current) drug therapy: Secondary | ICD-10-CM | POA: Diagnosis not present

## 2013-12-10 DIAGNOSIS — X58XXXA Exposure to other specified factors, initial encounter: Secondary | ICD-10-CM | POA: Insufficient documentation

## 2013-12-10 DIAGNOSIS — J984 Other disorders of lung: Secondary | ICD-10-CM | POA: Insufficient documentation

## 2013-12-10 DIAGNOSIS — C3491 Malignant neoplasm of unspecified part of right bronchus or lung: Secondary | ICD-10-CM

## 2013-12-10 DIAGNOSIS — M545 Low back pain, unspecified: Secondary | ICD-10-CM | POA: Insufficient documentation

## 2013-12-10 DIAGNOSIS — Z510 Encounter for antineoplastic radiation therapy: Secondary | ICD-10-CM | POA: Diagnosis not present

## 2013-12-10 DIAGNOSIS — M7989 Other specified soft tissue disorders: Secondary | ICD-10-CM | POA: Insufficient documentation

## 2013-12-10 DIAGNOSIS — Z7982 Long term (current) use of aspirin: Secondary | ICD-10-CM | POA: Insufficient documentation

## 2013-12-10 DIAGNOSIS — L539 Erythematous condition, unspecified: Secondary | ICD-10-CM | POA: Diagnosis not present

## 2013-12-10 DIAGNOSIS — L989 Disorder of the skin and subcutaneous tissue, unspecified: Secondary | ICD-10-CM | POA: Insufficient documentation

## 2013-12-10 DIAGNOSIS — C3430 Malignant neoplasm of lower lobe, unspecified bronchus or lung: Secondary | ICD-10-CM | POA: Insufficient documentation

## 2013-12-10 DIAGNOSIS — S91309A Unspecified open wound, unspecified foot, initial encounter: Secondary | ICD-10-CM | POA: Diagnosis not present

## 2013-12-10 NOTE — Progress Notes (Signed)
Thoracic Location of Tumor / Histology: squamous cell lung cancer, right upper lobe pulmonary nodule, left lung base nodule  Patient had a CT scan of her chest on 11/20/13 which showed "Interval increase in size of right upper lobe pulmonary nodule concerning for progression of metastatic bronchogenic carcinoma or a  secondary primary bronchogenic carcinoma. Extensive interstitial lung disease with frank honeycombing at the lung bases. Small 7 mm nodule at the left lung base, not seen on prior, recommend attention on follow-up. "  Tobacco/Marijuana/Snuff/ETOH use: quit smoking 03/2013, smoked 1.5 packs per day for 45 years, no ETOH use  Past/Anticipated interventions by cardiothoracic surgery, if any: no  Past/Anticipated interventions by medical oncology, if any: no  Signs/Symptoms  Weight changes, if any: has lost 11 lbs since 11/13/13.  Respiratory complaints, if any: occasional cough, has shortness of breath with activity, oxygen saturation is 97% on room air, she uses 2 l of oxygen prn at home  Hemoptysis, if any: no  Pain issues, if any:  Has pain in her lower back, rating at 10/10, started 4 weeks ago.  She is taking percocet .5 tablet TID PRN.     SAFETY ISSUES:  Prior radiation? Yes - SBRT to right lower lung 12/15  Pacemaker/ICD? no   Possible current pregnancy?no  Is the patient on methotrexate? no  Current Complaints / other details:  Patient reports trouble swallowing and food is getting stuck in her throat.  She has lost 9 lbs since 11/13/13.  She has severe lower back pain.  She also has   Patient has a necrotic wound surrounded by redness on her left food and a black blister on her right foot.  She has pitting edema in both feet.  Her pedal pulses are not palpable and her feet are cold to the touch.  She reports both feet are numb.

## 2013-12-10 NOTE — Progress Notes (Signed)
Please see the Nurse Progress Note in the MD Initial Consult Encounter for this patient. 

## 2013-12-10 NOTE — Progress Notes (Signed)
Radiation Oncology         [phone] ________________________________  Name: Olivia Nichols MRN: [id number]  Date: 12/10/2013  DOB: 02/01/1946  Reevaluation Note  CC: Dorise Hiss, MD  Diagnosis: Clinical stage I non-small cell lung cancer presenting in the right lower lung field  Interval Since Last Radiation:  7  months  Narrative:  The patient returns today for further evaluation. She was seen last month at her 6 month followup after her SBRT directed at the right lower lung lesion. A repeat chest CT scan showed  shrinkage of this lesion however a lesion in the right upper lung area had progressed.   The patient continues to be bothered by low back pain. She recently injured her left foot has developed a open lesion in this area.  She also has had problems with significant edema in her lower extremities.                        ALLERGIES:  is allergic to codeine; morphine and related; tramadol; and trazodone and nefazodone.  Meds: Current Outpatient Prescriptions  Medication Sig Dispense Refill  . albuterol (PROAIR HFA) 108 (90 BASE) MCG/ACT inhaler Inhale into the lungs.      [name] albuterol (PROVENTIL HFA;VENTOLIN HFA) 108 (90 BASE) MCG/ACT inhaler Inhale 2 puffs into the lungs every 4 (four) hours as needed for wheezing.      [name] amitriptyline (ELAVIL) 25 MG tablet Take 25 mg by mouth at bedtime.      [name] aspirin 325 MG tablet Take 325-650 mg by mouth daily. For pain      . clonazePAM (KLONOPIN) 1 MG tablet Take 1 mg by mouth 2 (two) times daily as needed for anxiety.      . fenofibrate 160 MG tablet Take 160 mg by mouth daily.      [name] losartan (COZAAR) 50 MG tablet Take 2 tablets (100 mg total) by mouth daily.  60 tablet  1  . omeprazole (PRILOSEC) 40 MG capsule Take 40 mg by mouth daily.      [name] oxyCODONE-acetaminophen (PERCOCET/ROXICET) 5-325 MG per tablet Take 0.5 tablets by mouth 3 (three) times daily.      . pravastatin (PRAVACHOL) 40 MG tablet Take 40 mg  by mouth at bedtime.      . predniSONE (DELTASONE) 10 MG tablet Take 4 tabs once daily for 3 days, then take 2 tabs once daily for 3 days, then take 1 tab once daily for 3 days and then 5mg  once daily as before.  21 tablet  0  . acetaminophen (RA ACETAMINOPHEN) 650 MG CR tablet Take 650 mg by mouth.      . moxifloxacin (AVELOX) 400 MG tablet Take 400 mg by mouth daily at 8 pm.      . nicotine (NICODERM CQ - DOSED IN MG/24 HOURS) 14 mg/24hr patch Place 14 mg onto the skin every other day.      . promethazine (PHENERGAN) 6.25 MG/5ML syrup Take by mouth.      . [DISCONTINUED] lisinopril (PRINIVIL,ZESTRIL) 20 MG tablet Take 20 mg by mouth daily.       No current facility-administered medications for this encounter.    Physical Findings: The patient is in no acute distress. Patient is alert and oriented.  height is 5\' 8"  (1.727 m) and weight is 128 lb 9.6 oz (58.333 kg). Her oral temperature is 98 F (36.7 C). Her blood pressure is 107/61 and her pulse  is 101. Her oxygen saturation is 97%. . Patient is accompanied by her daughter on evaluation today. Patient continues have a lesion along her left neck suspicious for malignancy. The area shows 2 separate lesions. Both lesions are pedunculated. The lung exam reveals bibasilar crackles.  The heart has a regular rhythm and rate. Examination of the lower extremities reveals both feet to be a swollen and erythematous and somewhat cold to touch.   Along the dorsum area of the right foot is an ulceration measuring approximately 2 cm in size. There is erythema surrounding this area suspicious for infection   Lab Findings: Lab Results  Component Value Date   WBC 2.0* 11/13/2013   HGB 10.1* 11/13/2013   HCT 29.7* 11/13/2013   MCV 84.0 11/13/2013   PLT 341 11/13/2013      Radiographic Findings: Ct Chest W Contrast  11/20/2013   CLINICAL DATA:  Lung cancer diagnosed 2014. Radiation therapy complete.  EXAM: CT CHEST WITH CONTRAST  TECHNIQUE: Multidetector CT  imaging of the chest was performed during intravenous contrast administration.  CONTRAST:  25mL OMNIPAQUE IOHEXOL 300 MG/ML  SOLN  COMPARISON:  CT 04/08/2013  FINDINGS: No axillary supraclavicular lymphadenopathy. No mediastinal hilar lymphadenopathy. No pericardial fluid. Coronary calcifications noted. Hiatal hernia noted. No central pulmonary embolism.  Right lower lobe nodule adjacent to the pleural surface measures 25 x 15 mm compared to 28 x 17 mm on prior. There is interval enlargement of adjacent thin-walled cystic lesion. There is increased interstitial thickening and consolidation in the right lower lobe surrounding the lesion suggesting radiation change.  Within the right upper lobe, irregular a nodule along the mediastinal border is increased in size measuring 22 by 13 mm compared to 12 by 11 mm on prior (image 10 number thigh.  Within the left lower lobe, 7 mm nodule (image 32, series 5) is not seen on prior.  There is interstitial lung disease with peripheral reticulation and a mild frank honeycombing at the lung bases.  Review of the upper abdomen demonstrates normal adrenal glands. There is no focal lesion in the liver limited exam of the liver. Review of the skeleton demonstrates no acute or aggressive osseous lesions.  IMPRESSION: 1. Interval decrease in size of right lower lobe pulmonary nodule with associated consolidation and interstitial thickening likely representing radiation change. 2. Interval increase in size of right upper lobe pulmonary nodule concerning for progression of metastatic bronchogenic carcinoma or a secondary primary bronchogenic carcinoma. 3. Extensive interstitial lung disease with frank honeycombing at the lung bases. 4. Small 7 mm nodule at the left lung base, not seen on prior, recommend attention on follow-up.   Electronically Signed   By: Suzy Bouchard M.D.   On: 11/20/2013 11:56    Impression:  clinical stage I non-small cell lung cancer presenting in the right  lower lung field, status post stereotactic body radiation therapy. This lesion has decreased in size on most recent chest CT scan. Patient has a progressive lesion in the right upper lung area which is concerning for malignancy. I discussed consideration for treatment of this area. Given the location the patient would not be able to have stereotactic body radiation therapy but could receive hypo fractionated treatments over approximately 10 sessions. I recommend the patient call her primary care physician tomorrow morning to have evaluation of her feet. She may require antibiotics or other interventions concerning this issue. Also recommended consideration for surgical removal of her skin lesions along the left neck.   Plan:  The patient or her daughter will call after her foot situation has stabilized to be considered for radiation treatments for her right upper lung lesion.   ____________________________________ Blair Promise, MD

## 2013-12-16 DIAGNOSIS — C4490 Unspecified malignant neoplasm of skin, unspecified: Secondary | ICD-10-CM

## 2013-12-16 HISTORY — DX: Unspecified malignant neoplasm of skin, unspecified: C44.90

## 2013-12-17 ENCOUNTER — Encounter (HOSPITAL_COMMUNITY): Payer: Self-pay | Admitting: Emergency Medicine

## 2013-12-17 ENCOUNTER — Emergency Department (HOSPITAL_COMMUNITY): Payer: Medicare Other

## 2013-12-17 ENCOUNTER — Inpatient Hospital Stay (HOSPITAL_COMMUNITY)
Admission: EM | Admit: 2013-12-17 | Discharge: 2013-12-24 | DRG: 871 | Disposition: A | Discharge status: Home / Self Care | Payer: Medicare Other | Attending: Internal Medicine | Admitting: Internal Medicine

## 2013-12-17 DIAGNOSIS — R339 Retention of urine, unspecified: Secondary | ICD-10-CM

## 2013-12-17 DIAGNOSIS — N179 Acute kidney failure, unspecified: Secondary | ICD-10-CM | POA: Diagnosis present

## 2013-12-17 DIAGNOSIS — E872 Acidosis, unspecified: Secondary | ICD-10-CM | POA: Diagnosis present

## 2013-12-17 DIAGNOSIS — Z7982 Long term (current) use of aspirin: Secondary | ICD-10-CM

## 2013-12-17 DIAGNOSIS — I9589 Other hypotension: Secondary | ICD-10-CM

## 2013-12-17 DIAGNOSIS — Z85828 Personal history of other malignant neoplasm of skin: Secondary | ICD-10-CM | POA: Diagnosis not present

## 2013-12-17 DIAGNOSIS — N390 Urinary tract infection, site not specified: Secondary | ICD-10-CM

## 2013-12-17 DIAGNOSIS — Z923 Personal history of irradiation: Secondary | ICD-10-CM

## 2013-12-17 DIAGNOSIS — J449 Chronic obstructive pulmonary disease, unspecified: Secondary | ICD-10-CM | POA: Diagnosis present

## 2013-12-17 DIAGNOSIS — A0472 Enterocolitis due to Clostridium difficile, not specified as recurrent: Secondary | ICD-10-CM | POA: Diagnosis present

## 2013-12-17 DIAGNOSIS — J439 Emphysema, unspecified: Secondary | ICD-10-CM

## 2013-12-17 DIAGNOSIS — J438 Other emphysema: Secondary | ICD-10-CM

## 2013-12-17 DIAGNOSIS — M069 Rheumatoid arthritis, unspecified: Secondary | ICD-10-CM | POA: Diagnosis present

## 2013-12-17 DIAGNOSIS — K219 Gastro-esophageal reflux disease without esophagitis: Secondary | ICD-10-CM | POA: Diagnosis present

## 2013-12-17 DIAGNOSIS — Z8249 Family history of ischemic heart disease and other diseases of the circulatory system: Secondary | ICD-10-CM | POA: Diagnosis not present

## 2013-12-17 DIAGNOSIS — D709 Neutropenia, unspecified: Secondary | ICD-10-CM

## 2013-12-17 DIAGNOSIS — I472 Ventricular tachycardia, unspecified: Secondary | ICD-10-CM

## 2013-12-17 DIAGNOSIS — A419 Sepsis, unspecified organism: Secondary | ICD-10-CM | POA: Diagnosis not present

## 2013-12-17 DIAGNOSIS — Z9981 Dependence on supplemental oxygen: Secondary | ICD-10-CM | POA: Diagnosis not present

## 2013-12-17 DIAGNOSIS — I10 Essential (primary) hypertension: Secondary | ICD-10-CM

## 2013-12-17 DIAGNOSIS — E2749 Other adrenocortical insufficiency: Secondary | ICD-10-CM | POA: Diagnosis present

## 2013-12-17 DIAGNOSIS — IMO0002 Reserved for concepts with insufficient information to code with codable children: Secondary | ICD-10-CM

## 2013-12-17 DIAGNOSIS — Z9849 Cataract extraction status, unspecified eye: Secondary | ICD-10-CM | POA: Diagnosis not present

## 2013-12-17 DIAGNOSIS — E876 Hypokalemia: Secondary | ICD-10-CM

## 2013-12-17 DIAGNOSIS — E7800 Pure hypercholesterolemia, unspecified: Secondary | ICD-10-CM

## 2013-12-17 DIAGNOSIS — C3491 Malignant neoplasm of unspecified part of right bronchus or lung: Secondary | ICD-10-CM

## 2013-12-17 DIAGNOSIS — J4489 Other specified chronic obstructive pulmonary disease: Secondary | ICD-10-CM | POA: Diagnosis present

## 2013-12-17 DIAGNOSIS — Z885 Allergy status to narcotic agent status: Secondary | ICD-10-CM | POA: Diagnosis not present

## 2013-12-17 DIAGNOSIS — J189 Pneumonia, unspecified organism: Secondary | ICD-10-CM

## 2013-12-17 DIAGNOSIS — R6520 Severe sepsis without septic shock: Secondary | ICD-10-CM | POA: Diagnosis present

## 2013-12-17 DIAGNOSIS — C3490 Malignant neoplasm of unspecified part of unspecified bronchus or lung: Secondary | ICD-10-CM | POA: Diagnosis present

## 2013-12-17 DIAGNOSIS — R9439 Abnormal result of other cardiovascular function study: Secondary | ICD-10-CM

## 2013-12-17 DIAGNOSIS — G894 Chronic pain syndrome: Secondary | ICD-10-CM

## 2013-12-17 DIAGNOSIS — I4729 Other ventricular tachycardia: Secondary | ICD-10-CM

## 2013-12-17 DIAGNOSIS — I959 Hypotension, unspecified: Secondary | ICD-10-CM | POA: Diagnosis present

## 2013-12-17 DIAGNOSIS — D649 Anemia, unspecified: Secondary | ICD-10-CM | POA: Diagnosis present

## 2013-12-17 DIAGNOSIS — K5900 Constipation, unspecified: Secondary | ICD-10-CM | POA: Diagnosis present

## 2013-12-17 DIAGNOSIS — Z720 Tobacco use: Secondary | ICD-10-CM

## 2013-12-17 DIAGNOSIS — M1990 Unspecified osteoarthritis, unspecified site: Secondary | ICD-10-CM | POA: Diagnosis present

## 2013-12-17 DIAGNOSIS — I2510 Atherosclerotic heart disease of native coronary artery without angina pectoris: Secondary | ICD-10-CM | POA: Diagnosis present

## 2013-12-17 DIAGNOSIS — B029 Zoster without complications: Secondary | ICD-10-CM

## 2013-12-17 DIAGNOSIS — E785 Hyperlipidemia, unspecified: Secondary | ICD-10-CM

## 2013-12-17 DIAGNOSIS — E871 Hypo-osmolality and hyponatremia: Secondary | ICD-10-CM | POA: Diagnosis present

## 2013-12-17 DIAGNOSIS — F17200 Nicotine dependence, unspecified, uncomplicated: Secondary | ICD-10-CM | POA: Diagnosis present

## 2013-12-17 DIAGNOSIS — Z85118 Personal history of other malignant neoplasm of bronchus and lung: Secondary | ICD-10-CM

## 2013-12-17 DIAGNOSIS — R571 Hypovolemic shock: Secondary | ICD-10-CM | POA: Diagnosis present

## 2013-12-17 DIAGNOSIS — R6521 Severe sepsis with septic shock: Secondary | ICD-10-CM

## 2013-12-17 HISTORY — DX: Unspecified malignant neoplasm of skin, unspecified: C44.90

## 2013-12-17 LAB — URINALYSIS, ROUTINE W REFLEX MICROSCOPIC
Glucose, UA: 100 mg/dL — AB
HGB URINE DIPSTICK: NEGATIVE
Ketones, ur: 15 mg/dL — AB
Nitrite: POSITIVE — AB
PH: 5.5 (ref 5.0–8.0)
Protein, ur: 30 mg/dL — AB
SPECIFIC GRAVITY, URINE: 1.023 (ref 1.005–1.030)
Urobilinogen, UA: 1 mg/dL (ref 0.0–1.0)

## 2013-12-17 LAB — COMPREHENSIVE METABOLIC PANEL
ALBUMIN: 2.3 g/dL — AB (ref 3.5–5.2)
ALT: 11 U/L (ref 0–35)
ANION GAP: 18 — AB (ref 5–15)
AST: 38 U/L — ABNORMAL HIGH (ref 0–37)
Alkaline Phosphatase: 63 U/L (ref 39–117)
BILIRUBIN TOTAL: 0.9 mg/dL (ref 0.3–1.2)
BUN: 30 mg/dL — ABNORMAL HIGH (ref 6–23)
CHLORIDE: 89 meq/L — AB (ref 96–112)
CO2: 21 meq/L (ref 19–32)
Calcium: 7.6 mg/dL — ABNORMAL LOW (ref 8.4–10.5)
Creatinine, Ser: 2.21 mg/dL — ABNORMAL HIGH (ref 0.50–1.10)
GFR calc non Af Amer: 21 mL/min — ABNORMAL LOW (ref >90)
GFR, EST AFRICAN AMERICAN: 25 mL/min — AB (ref >90)
GLUCOSE: 127 mg/dL — AB (ref 70–99)
POTASSIUM: 3.5 meq/L — AB (ref 3.7–5.3)
SODIUM: 128 meq/L — AB (ref 137–147)
TOTAL PROTEIN: 5.5 g/dL — AB (ref 6.0–8.3)

## 2013-12-17 LAB — CBC WITH DIFFERENTIAL/PLATELET
Basophils Absolute: 0 10*3/uL (ref 0.0–0.1)
Basophils Relative: 0 % (ref 0–1)
Eosinophils Absolute: 0 10*3/uL (ref 0.0–0.7)
Eosinophils Relative: 0 % (ref 0–5)
HEMATOCRIT: 26.2 % — AB (ref 36.0–46.0)
HEMOGLOBIN: 8.8 g/dL — AB (ref 12.0–15.0)
LYMPHS PCT: 29 % (ref 12–46)
Lymphs Abs: 0.2 10*3/uL — ABNORMAL LOW (ref 0.7–4.0)
MCH: 28.1 pg (ref 26.0–34.0)
MCHC: 33.6 g/dL (ref 30.0–36.0)
MCV: 83.7 fL (ref 78.0–100.0)
MONOS PCT: 31 % — AB (ref 3–12)
Monocytes Absolute: 0.2 10*3/uL (ref 0.1–1.0)
NEUTROS ABS: 0.2 10*3/uL — AB (ref 1.7–7.7)
Neutrophils Relative %: 40 % — ABNORMAL LOW (ref 43–77)
Platelets: 171 10*3/uL (ref 150–400)
RBC: 3.13 MIL/uL — ABNORMAL LOW (ref 3.87–5.11)
RDW: 15.1 % (ref 11.5–15.5)
WBC MORPHOLOGY: INCREASED
WBC: 0.6 10*3/uL — AB (ref 4.0–10.5)

## 2013-12-17 LAB — GLUCOSE, CAPILLARY: Glucose-Capillary: 131 mg/dL — ABNORMAL HIGH (ref 70–99)

## 2013-12-17 LAB — I-STAT CG4 LACTIC ACID, ED: LACTIC ACID, VENOUS: 3.81 mmol/L — AB (ref 0.5–2.2)

## 2013-12-17 LAB — MRSA PCR SCREENING: MRSA BY PCR: NEGATIVE

## 2013-12-17 LAB — URINE MICROSCOPIC-ADD ON

## 2013-12-17 LAB — TROPONIN I

## 2013-12-17 LAB — LACTIC ACID, PLASMA: LACTIC ACID, VENOUS: 2.4 mmol/L — AB (ref 0.5–2.2)

## 2013-12-17 LAB — PRO B NATRIURETIC PEPTIDE: Pro B Natriuretic peptide (BNP): 10581 pg/mL — ABNORMAL HIGH (ref 0–125)

## 2013-12-17 MED ORDER — SODIUM CHLORIDE 0.9 % IV BOLUS (SEPSIS): 250.0000 mL | Freq: Once | INTRAVENOUS | Status: DC

## 2013-12-17 MED ORDER — TIZANIDINE HCL 2 MG PO TABS
2.0000 mg | ORAL_TABLET | Freq: Three times a day (TID) | ORAL | Status: DC | PRN
  Filled 2013-12-17: qty 1

## 2013-12-17 MED ORDER — PANTOPRAZOLE SODIUM 40 MG PO TBEC
40.0000 mg | DELAYED_RELEASE_TABLET | Freq: Every day | ORAL | Status: DC
  Administered 2013-12-18 – 2013-12-22 (×5): 40 mg via ORAL
  Filled 2013-12-17 (×3): qty 1

## 2013-12-17 MED ORDER — VANCOMYCIN HCL IN DEXTROSE 1-5 GM/200ML-% IV SOLN
1000.0000 mg | Freq: Once | INTRAVENOUS | Status: AC
  Administered 2013-12-17: 1000 mg via INTRAVENOUS
  Filled 2013-12-17: qty 200

## 2013-12-17 MED ORDER — FENOFIBRATE 160 MG PO TABS: 160.0000 mg | ORAL_TABLET | Freq: Every day | ORAL | Status: DC

## 2013-12-17 MED ORDER — SODIUM CHLORIDE 0.9 % IV BOLUS (SEPSIS)
1000.0000 mL | Freq: Once | INTRAVENOUS | Status: AC
  Administered 2013-12-17: 1000 mL via INTRAVENOUS

## 2013-12-17 MED ORDER — SODIUM CHLORIDE 0.9 % IV SOLN: INTRAVENOUS | Status: DC

## 2013-12-17 MED ORDER — FLUTICASONE FUROATE-VILANTEROL 100-25 MCG/INH IN AEPB: 1.0000 | INHALATION_SPRAY | Freq: Every day | RESPIRATORY_TRACT | Status: DC

## 2013-12-17 MED ORDER — PIPERACILLIN-TAZOBACTAM 3.375 G IVPB 30 MIN
3.3750 g | Freq: Once | INTRAVENOUS | Status: AC
  Administered 2013-12-17: 3.375 g via INTRAVENOUS
  Filled 2013-12-17: qty 50

## 2013-12-17 MED ORDER — BETHANECHOL CHLORIDE 25 MG PO TABS
25.0000 mg | ORAL_TABLET | Freq: Three times a day (TID) | ORAL | Status: DC
  Administered 2013-12-17 – 2013-12-21 (×11): 25 mg via ORAL
  Filled 2013-12-17 (×14): qty 1

## 2013-12-17 MED ORDER — LOSARTAN POTASSIUM 50 MG PO TABS: 100.0000 mg | ORAL_TABLET | Freq: Every day | ORAL | Status: DC

## 2013-12-17 MED ORDER — BUDESONIDE-FORMOTEROL FUMARATE 160-4.5 MCG/ACT IN AERO
2.0000 | INHALATION_SPRAY | Freq: Two times a day (BID) | RESPIRATORY_TRACT | Status: DC
  Administered 2013-12-18 – 2013-12-24 (×12): 2 via RESPIRATORY_TRACT
  Filled 2013-12-17: qty 6

## 2013-12-17 MED ORDER — SODIUM CHLORIDE 0.9 % IV SOLN
250.0000 mL | INTRAVENOUS | Status: DC | PRN
  Administered 2013-12-21: 250 mL via INTRAVENOUS

## 2013-12-17 MED ORDER — ALBUTEROL SULFATE (2.5 MG/3ML) 0.083% IN NEBU: 2.5000 mg | INHALATION_SOLUTION | RESPIRATORY_TRACT | Status: DC | PRN

## 2013-12-17 MED ORDER — HEPARIN SODIUM (PORCINE) 5000 UNIT/ML IJ SOLN
5000.0000 [IU] | Freq: Three times a day (TID) | INTRAMUSCULAR | Status: DC
  Administered 2013-12-17 – 2013-12-24 (×20): 5000 [IU] via SUBCUTANEOUS
  Filled 2013-12-17 (×24): qty 1

## 2013-12-17 MED ORDER — PIPERACILLIN-TAZOBACTAM 3.375 G IVPB
3.3750 g | Freq: Three times a day (TID) | INTRAVENOUS | Status: DC
  Administered 2013-12-17 – 2013-12-22 (×14): 3.375 g via INTRAVENOUS
  Filled 2013-12-17 (×20): qty 50

## 2013-12-17 MED ORDER — VANCOMYCIN HCL 500 MG IV SOLR
500.0000 mg | INTRAVENOUS | Status: DC
  Administered 2013-12-18 – 2013-12-21 (×4): 500 mg via INTRAVENOUS
  Filled 2013-12-17 (×4): qty 500

## 2013-12-17 MED ORDER — ONDANSETRON HCL 4 MG/2ML IJ SOLN: 4.0000 mg | Freq: Three times a day (TID) | INTRAMUSCULAR | Status: DC | PRN

## 2013-12-17 MED ORDER — ASPIRIN 325 MG PO TABS
325.0000 mg | ORAL_TABLET | Freq: Every day | ORAL | Status: DC
Start: 2013-12-17 — End: 2013-12-17
  Filled 2013-12-17: qty 2

## 2013-12-17 MED ORDER — SENNA 8.6 MG PO TABS
2.0000 | ORAL_TABLET | Freq: Every evening | ORAL | Status: DC | PRN
  Filled 2013-12-17: qty 2

## 2013-12-17 MED ORDER — SODIUM CHLORIDE 0.9 % IV SOLN
INTRAVENOUS | Status: DC
  Administered 2013-12-17 (×3): via INTRAVENOUS
  Administered 2013-12-18: 999 mL via INTRAVENOUS
  Administered 2013-12-19 – 2013-12-20 (×2): via INTRAVENOUS
  Administered 2013-12-21: 1000 mL via INTRAVENOUS

## 2013-12-17 MED ORDER — HYDROCORTISONE NA SUCCINATE PF 100 MG IJ SOLR
50.0000 mg | Freq: Four times a day (QID) | INTRAMUSCULAR | Status: DC
  Administered 2013-12-17 – 2013-12-18 (×4): 50 mg via INTRAVENOUS
  Filled 2013-12-17 (×3): qty 1
  Filled 2013-12-17: qty 2
  Filled 2013-12-17 (×4): qty 1

## 2013-12-17 MED ORDER — SIMVASTATIN 20 MG PO TABS
20.0000 mg | ORAL_TABLET | Freq: Every day | ORAL | Status: DC
  Administered 2013-12-18 – 2013-12-20 (×3): 20 mg via ORAL
  Filled 2013-12-17 (×5): qty 1

## 2013-12-17 MED ORDER — ACETAMINOPHEN 325 MG PO TABS: 650.0000 mg | ORAL_TABLET | Freq: Three times a day (TID) | ORAL | Status: DC | PRN

## 2013-12-17 NOTE — ED Provider Notes (Signed)
CSN: [id number]     Arrival date & time 12/17/13  1036 History   First MD Initiated Contact with Patient 12/17/13 1042     Chief Complaint  Patient presents with  . Hypotension  . Weakness     (Consider location/radiation/quality/duration/timing/severity/associated sxs/prior Treatment) Patient is a 68 y.o. female presenting with weakness. The history is provided by the patient.  Weakness Pertinent negatives include no chest pain, no abdominal pain, no headaches and no shortness of breath.   patient brought in by EMS for hypotension initial call went out for weakness for 2 days. Patient had a skin cancer removed from her left lateral neck yesterday it [place]. Patient been feeling weak for 2 days. Patient also took medicine for constipation yesterday. Feels she may be dehydrated. EMS when they arrived noted the blood pressure was 50 systolic. Patient got 1 L fluid in route blood pressure upon arrival manually was in the mid 80s. Patient was reported to be pale at the scene but upon arrival skin color was good.  Past Medical History  Diagnosis Date  . Tobacco abuse     Current smoker  . COPD (chronic obstructive pulmonary disease)   . Neutropenia, unspecified   . Rheumatoid arthritis   . Osteoarthritis   . Hyperlipidemia     On pravastatin  . Essential hypertension, benign     On Losartan  . Coronary atherosclerosis of native coronary artery     CT angiography 08/2012 showed significantly coronary calcification. On ASA. Stess Test 03/06/13 LV EF = 68%. High risk study due to ischemic dilatation & mid to distal anterior ischemia.  [name] History of radiation therapy 05/19/13, 05/21/2013, 05/23/2013, 05/26/2013    SBRT right lower lung nodule, 50 gray  . Cancer 09/12/12    Squamous cell lung cancer. RIGHT LOWER LOBE PNA.  . Skin cancer 12/16/13    removed from left neck   Past Surgical History  Procedure Laterality Date  . Lung biopsy  09/12/12    [place]  . Cardiac  catheterization    . Cataract extraction Left   . Skin cancer excision      top lip, right side   Family History  Problem Relation Age of Onset  . CAD Mother   . Peripheral vascular disease Mother   . CVA Father   . CAD Father   . COPD Brother   . Mitral valve prolapse Child   . Diabetes Child   . Mitral valve prolapse Child   . CAD Brother    History  Substance Use Topics  . Smoking status: Former Smoker -- 1.50 packs/day for 45 years    Types: Cigarettes    Quit date: 03/30/2013  . Smokeless tobacco: Not on file  . Alcohol Use: No   OB History   Grav Para Term Preterm Abortions TAB SAB Ect Mult Living                 Review of Systems  Constitutional: Positive for fatigue. Negative for fever.  HENT: Negative for congestion.   Eyes: Negative for redness and visual disturbance.  Respiratory: Negative for shortness of breath.   Cardiovascular: Negative for chest pain.  Gastrointestinal: Negative for abdominal pain.  Genitourinary: Negative for dysuria.  Musculoskeletal: Positive for back pain.  Skin: Negative for rash.  Neurological: Positive for weakness. Negative for headaches.  Hematological: Does not bruise/bleed easily.  Psychiatric/Behavioral: Negative for confusion.      Allergies  Codeine; Morphine and related; Tramadol; and Trazodone  and nefazodone  Home Medications   Prior to Admission medications   Medication Sig Start Date End Date Taking? Authorizing Provider  albuterol (PROVENTIL HFA;VENTOLIN HFA) 108 (90 BASE) MCG/ACT inhaler Inhale 2 puffs into the lungs every 4 (four) hours as needed for wheezing.   Yes Historical Provider, MD  albuterol (PROVENTIL) (2.5 MG/3ML) 0.083% nebulizer solution Take 2.5 mg by nebulization every 6 (six) hours as needed for wheezing or shortness of breath.   Yes Historical Provider, MD  amitriptyline (ELAVIL) 25 MG tablet Take 25 mg by mouth at bedtime.   Yes Historical Provider, MD  aspirin 325 MG tablet Take 325-650  mg by mouth daily. For pain   Yes Historical Provider, MD  bethanechol (URECHOLINE) 25 MG tablet Take 25 mg by mouth 3 (three) times daily.   Yes Historical Provider, MD  clonazePAM (KLONOPIN) 1 MG tablet Take 1 mg by mouth 2 (two) times daily as needed for anxiety.   Yes Historical Provider, MD  fenofibrate 160 MG tablet Take 160 mg by mouth daily.   Yes Historical Provider, MD  Fluticasone Furoate-Vilanterol (BREO ELLIPTA) 100-25 MCG/INH AEPB Inhale 1 puff into the lungs daily.   Yes Historical Provider, MD  losartan (COZAAR) 100 MG tablet Take 100 mg by mouth daily.   Yes Historical Provider, MD  omeprazole (PRILOSEC) 40 MG capsule Take 40 mg by mouth daily.   Yes Historical Provider, MD  oxyCODONE-acetaminophen (PERCOCET/ROXICET) 5-325 MG per tablet Take 0.5 tablets by mouth 3 (three) times daily.   Yes Historical Provider, MD  pravastatin (PRAVACHOL) 40 MG tablet Take 40 mg by mouth at bedtime.   Yes Historical Provider, MD  predniSONE (DELTASONE) 5 MG tablet Take 10 mg by mouth daily with breakfast.   Yes Historical Provider, MD  tiZANidine (ZANAFLEX) 4 MG tablet Take 4 mg by mouth every 8 (eight) hours as needed for muscle spasms.   Yes Historical Provider, MD   BP 107/50  Pulse 94  Temp(Src) 100.6 F (38.1 C) (Oral)  Resp 27  SpO2 100% Physical Exam  Nursing note and vitals reviewed. Constitutional: She is oriented to person, place, and time. She appears well-developed and well-nourished. She appears distressed.  HENT:  Head: Normocephalic and atraumatic.  Mouth/Throat: Oropharynx is clear and moist.  Eyes: Conjunctivae and EOM are normal. Pupils are equal, round, and reactive to light.  Neck: Normal range of motion.  Cardiovascular: Normal rate, regular rhythm and normal heart sounds.   No murmur heard. Pulmonary/Chest: Effort normal and breath sounds normal. No respiratory distress.  Abdominal: Soft. Bowel sounds are normal. There is no tenderness.  Musculoskeletal: Normal  range of motion. She exhibits no edema and no tenderness.  Neurological: She is alert and oriented to person, place, and time. No cranial nerve deficit. She exhibits normal muscle tone. Coordination normal.  Skin: Skin is warm. No rash noted. No pallor.    ED Course  Procedures (including critical care time) Labs Review Labs Reviewed  COMPREHENSIVE METABOLIC PANEL - Abnormal; Notable for the following:    Sodium 128 (*)    Potassium 3.5 (*)    Chloride 89 (*)    Glucose, Bld 127 (*)    BUN 30 (*)    Creatinine, Ser 2.21 (*)    Calcium 7.6 (*)    Total Protein 5.5 (*)    Albumin 2.3 (*)    AST 38 (*)    GFR calc non Af Amer 21 (*)    GFR calc Af Amer 25 (*)  Anion gap 18 (*)    All other components within normal limits  CBC WITH DIFFERENTIAL - Abnormal; Notable for the following:    WBC 0.6 (*)    RBC 3.13 (*)    Hemoglobin 8.8 (*)    HCT 26.2 (*)    Neutrophils Relative % 40 (*)    Monocytes Relative 31 (*)    Neutro Abs 0.2 (*)    Lymphs Abs 0.2 (*)    All other components within normal limits  PRO B NATRIURETIC PEPTIDE - Abnormal; Notable for the following:    Pro B Natriuretic peptide (BNP) 10581.0 (*)    All other components within normal limits  URINALYSIS, ROUTINE W REFLEX MICROSCOPIC - Abnormal; Notable for the following:    Color, Urine AMBER (*)    APPearance HAZY (*)    Glucose, UA 100 (*)    Bilirubin Urine SMALL (*)    Ketones, ur 15 (*)    Protein, ur 30 (*)    Nitrite POSITIVE (*)    Leukocytes, UA SMALL (*)    All other components within normal limits  URINE MICROSCOPIC-ADD ON - Abnormal; Notable for the following:    Squamous Epithelial / LPF MANY (*)    Bacteria, UA FEW (*)    Casts HYALINE CASTS (*)    All other components within normal limits  I-STAT CG4 LACTIC ACID, ED - Abnormal; Notable for the following:    Lactic Acid, Venous 3.81 (*)    All other components within normal limits  CULTURE, BLOOD (ROUTINE X 2)  CULTURE, BLOOD (ROUTINE X  2)  URINE CULTURE  TROPONIN I  LACTIC ACID, PLASMA   Results for orders placed during the hospital encounter of 12/17/13  TROPONIN I      Result Value Ref Range   Troponin I <0.30  <0.30 ng/mL  COMPREHENSIVE METABOLIC PANEL      Result Value Ref Range   Sodium 128 (*) 137 - 147 mEq/L   Potassium 3.5 (*) 3.7 - 5.3 mEq/L   Chloride 89 (*) 96 - 112 mEq/L   CO2 21  19 - 32 mEq/L   Glucose, Bld 127 (*) 70 - 99 mg/dL   BUN 30 (*) 6 - 23 mg/dL   Creatinine, Ser 2.21 (*) 0.50 - 1.10 mg/dL   Calcium 7.6 (*) 8.4 - 10.5 mg/dL   Total Protein 5.5 (*) 6.0 - 8.3 g/dL   Albumin 2.3 (*) 3.5 - 5.2 g/dL   AST 38 (*) 0 - 37 U/L   ALT 11  0 - 35 U/L   Alkaline Phosphatase 63  39 - 117 U/L   Total Bilirubin 0.9  0.3 - 1.2 mg/dL   GFR calc non Af Amer 21 (*) >90 mL/min   GFR calc Af Amer 25 (*) >90 mL/min   Anion gap 18 (*) 5 - 15  CBC WITH DIFFERENTIAL      Result Value Ref Range   WBC 0.6 (*) 4.0 - 10.5 K/uL   RBC 3.13 (*) 3.87 - 5.11 MIL/uL   Hemoglobin 8.8 (*) 12.0 - 15.0 g/dL   HCT 26.2 (*) 36.0 - 46.0 %   MCV 83.7  78.0 - 100.0 fL   MCH 28.1  26.0 - 34.0 pg   MCHC 33.6  30.0 - 36.0 g/dL   RDW 15.1  11.5 - 15.5 %   Platelets 171  150 - 400 K/uL   Neutrophils Relative % 40 (*) 43 - 77 %   Lymphocytes Relative 29  12 - 46 %   Monocytes Relative 31 (*) 3 - 12 %   Eosinophils Relative 0  0 - 5 %   Basophils Relative 0  0 - 1 %   Neutro Abs 0.2 (*) 1.7 - 7.7 K/uL   Lymphs Abs 0.2 (*) 0.7 - 4.0 K/uL   Monocytes Absolute 0.2  0.1 - 1.0 K/uL   Eosinophils Absolute 0.0  0.0 - 0.7 K/uL   Basophils Absolute 0.0  0.0 - 0.1 K/uL   WBC Morphology INCREASED BANDS (>20% BANDS)    PRO B NATRIURETIC PEPTIDE      Result Value Ref Range   Pro B Natriuretic peptide (BNP) 10581.0 (*) 0 - 125 pg/mL  URINALYSIS, ROUTINE W REFLEX MICROSCOPIC      Result Value Ref Range   Color, Urine AMBER (*) YELLOW   APPearance HAZY (*) CLEAR   Specific Gravity, Urine 1.023  1.005 - 1.030   pH 5.5  5.0 - 8.0    Glucose, UA 100 (*) NEGATIVE mg/dL   Hgb urine dipstick NEGATIVE  NEGATIVE   Bilirubin Urine SMALL (*) NEGATIVE   Ketones, ur 15 (*) NEGATIVE mg/dL   Protein, ur 30 (*) NEGATIVE mg/dL   Urobilinogen, UA 1.0  0.0 - 1.0 mg/dL   Nitrite POSITIVE (*) NEGATIVE   Leukocytes, UA SMALL (*) NEGATIVE  URINE MICROSCOPIC-ADD ON      Result Value Ref Range   Squamous Epithelial / LPF MANY (*) RARE   WBC, UA 3-6  <3 WBC/hpf   RBC / HPF 0-2  <3 RBC/hpf   Bacteria, UA FEW (*) RARE   Casts HYALINE CASTS (*) NEGATIVE  I-STAT CG4 LACTIC ACID, ED      Result Value Ref Range   Lactic Acid, Venous 3.81 (*) 0.5 - 2.2 mmol/L     Imaging Review Dg Chest Port 1 View  12/17/2013   CLINICAL DATA:  Hypertension. Weakness. History of lung cancer with radiation therapy.  EXAM: PORTABLE CHEST - 1 VIEW  COMPARISON:  CT 11/20/2013.  Radiographs 03/22/2013.  FINDINGS: 1123 hr. There are lower lung volumes with progressive coarse interstitial opacities throughout both lungs. There is probable superimposed bibasilar atelectasis. Patient is rotated to the right. Given this and the lower lung volumes, the heart size and mediastinal contours are grossly stable without progressive adenopathy. There is no pleural effusion or pneumothorax.  IMPRESSION: Much lower lung volumes with probable atelectasis superimposed on chronic lung disease. Pulmonary edema and atypical inflammation cannot be excluded.   Electronically Signed   By: Camie Patience M.D.   On: 12/17/2013 11:38     EKG Interpretation   Date/Time:  Wednesday December 17 2013 10:45:28 EDT Ventricular Rate:  98 PR Interval:  140 QRS Duration: 95 QT Interval:  364 QTC Calculation: 465 R Axis:   52 Text Interpretation:  Age not entered, assumed to be  68 years old for  purpose of ECG interpretation Sinus rhythm Anterior infarct, old No  significant change since last tracing Confirmed by Juanette Urizar  MD, Eldon Zietlow  [phone]) on 12/17/2013 10:53:07 AM      CRITICAL  CARE Performed by: Fredia Sorrow Total critical care time: 30 Critical care time was exclusive of separately billable procedures and treating other patients. Critical care was necessary to treat or prevent imminent or life-threatening deterioration. Critical care was time spent personally by me on the following activities: development of treatment plan with patient and/or surrogate as well as nursing, discussions with consultants, evaluation of patient's response to treatment,  examination of patient, obtaining history from patient or surrogate, ordering and performing treatments and interventions, ordering and review of laboratory studies, ordering and review of radiographic studies, pulse oximetry and re-evaluation of patient's condition.      MDM   Final diagnoses:  Sepsis, due to unspecified organism  Other specified hypotension    Patient brought in by [facility]. Patient said weakness for a couple days. Patient had a skin cancer removed from her left neck fairly large incision narrative about 6 cm. Patient has been taking pain medication prior to arrival 16 this surgical excision. Patient also fell she was constipated yesterday so underwent a lot of laxatives prescribed by her doctor feels like she is dehydrated. EMS was called for weakness when I got there her initial blood pressure was systolic of 50. Patient received 1 L of fluid and round and blood pressure was in the 51V systolic by manual cuff. Patient given more fluid blood pressure improved up into the 90s. Then started to drift back down again. Patient got a total of at least 2.5 L of fluid from [place]. Blood pressure back up into the 90s. Cultured of blood and urine patient started on septic antibiotics. Zosyn and vancomycin septic protocol initiated. Sac cause of the low blood pressure not clear patient's lactic acid was upper threes but less than 4. Patient was mentating fine throughout his had no specific complaints. Denied any  chest pain shortness of breath or abdominal pain. Patient's urinalysis positive nitrite but no significant leukocytosis. Patient's labs showed a marked leukopenia and neutropenia. Patient presumed to be septic will be admitted to ICU patient oxygen saturations have been fine. Patient on 3 L nasal cannula all the time. Chest x-ray without obvious pneumonia. Critical care will omit to ICU.    Fredia Sorrow, MD 12/17/13 [phone]

## 2013-12-17 NOTE — ED Notes (Signed)
Patient transported by [facility] for complaint of weakness x 2 days.  EMS reports patient hypotensive with initial BP of 50/30.  Last pressure reported was 60/pal.  Family reported to EMS that patient has been laying around for the past 2 days.  Patient also complaining of shortness of breath.  On home O2 @ 3 Liters per nasal cannula.  Also reports patient had skin cancer removed from left neck area and has been feeling like this ever since.  Patient prescribed hydrocodone for pain for skin cancer removal.

## 2013-12-17 NOTE — H&P (Signed)
PULMONARY / CRITICAL CARE MEDICINE   Name: Olivia Nichols MRN: [id number] DOB: 02/03/1946    ADMISSION DATE:  12/17/2013 CONSULTATION DATE:  12/17/13  REFERRING MD :  EDP PRIMARY SERVICE: PCCM  CHIEF COMPLAINT:  Hypotension and weakness  BRIEF PATIENT DESCRIPTION: 68 y/o F, former smoker, with PMH of SCCA of lung, O2 dependent COPD & RA (baseline 10 mg QD pred) admitted 7/15 with severe sepsis with suspected urinary source & hypotension that responded to IVF.  PCCM consulted for admission.     SIGNIFICANT EVENTS:  7/15 - Admit with severe sepsis, suspected urinary source  STUDIES:   LINES / TUBES:  CULTURES: UA 7/15>> nitrite positive, small leukocytes, few bacteria, 3-6 WBC BCx2 7/15 >>  ANTIBIOTICS: Vanco 7/15 >>  Zosyn 7/15 >>  HISTORY OF PRESENT ILLNESS:  68 y/o F, former smoker, with PMH of SCCA of lung s/p XRT of RLL nodule, O2 dependent COPD, Osteoarthritis, Skin CA s/p removal 7/14), HTN, CAD & RA (baseline 10 mg QD pred) who presented to [facility] ER on 7/15 with 2 wk history of decreased PO intake, decreased urination & one day history of nausea, vomiting, weakness and constipation. Patient used mag citrate (one bottle) on 7/14 and a second on 7/15 with results.  She reported some mild crampy abdominal pain.  She reports chills but no known fevers.  Denies chest pain, pain with inspiration, shortness of breath, bloody stool, hematemesis.  Patient reports low back pain which is chronic / unchanged.   Patient was seen 7/14 with a skin cancer removal on the left side of neck - has residual sutures.  ER work noted hypotension that responded to IVF, AKI (sr cr 2.2), lactic acidosis, hyponatremia, hypokalemia and elevated proBNP.  UA was concerning for UTI.  PCCM called for admission for severe sepsis with suspected urinary source.    PAST MEDICAL HISTORY :  Past Medical History  Diagnosis Date  . Tobacco abuse     Current smoker  . COPD (chronic obstructive pulmonary disease)   .  Neutropenia, unspecified   . Rheumatoid arthritis   . Osteoarthritis   . Hyperlipidemia     On pravastatin  . Essential hypertension, benign     On Losartan  . Coronary atherosclerosis of native coronary artery     CT angiography 08/2012 showed significantly coronary calcification. On ASA. Stess Test 03/06/13 LV EF = 68%. High risk study due to ischemic dilatation & mid to distal anterior ischemia.  [name] History of radiation therapy 05/19/13, 05/21/2013, 05/23/2013, 05/26/2013    SBRT right lower lung nodule, 50 gray  . Cancer 09/12/12    Squamous cell lung cancer. RIGHT LOWER LOBE PNA.  . Skin cancer 12/16/13    removed from left neck   Past Surgical History  Procedure Laterality Date  . Lung biopsy  09/12/12    [place]  . Cardiac catheterization    . Cataract extraction Left   . Skin cancer excision      top lip, right side   Prior to Admission medications   Medication Sig Start Date End Date Taking? Authorizing Provider  albuterol (PROVENTIL HFA;VENTOLIN HFA) 108 (90 BASE) MCG/ACT inhaler Inhale 2 puffs into the lungs every 4 (four) hours as needed for wheezing.   Yes Historical Provider, MD  amitriptyline (ELAVIL) 25 MG tablet Take 25 mg by mouth at bedtime.   Yes Historical Provider, MD  aspirin 325 MG tablet Take 325-650 mg by mouth daily. For pain   Yes Historical Provider, MD  bethanechol (URECHOLINE) 25 MG tablet Take 25 mg by mouth 3 (three) times daily.   Yes Historical Provider, MD  clonazePAM (KLONOPIN) 1 MG tablet Take 1 mg by mouth 2 (two) times daily as needed for anxiety.   Yes Historical Provider, MD  fenofibrate 160 MG tablet Take 160 mg by mouth daily.   Yes Historical Provider, MD  losartan (COZAAR) 100 MG tablet Take 100 mg by mouth daily.   Yes Historical Provider, MD  omeprazole (PRILOSEC) 40 MG capsule Take 40 mg by mouth daily.   Yes Historical Provider, MD  oxyCODONE-acetaminophen (PERCOCET/ROXICET) 5-325 MG per tablet Take 0.5 tablets by mouth 3  (three) times daily.   Yes Historical Provider, MD  pravastatin (PRAVACHOL) 40 MG tablet Take 40 mg by mouth at bedtime.   Yes Historical Provider, MD  predniSONE (DELTASONE) 5 MG tablet Take 10 mg by mouth daily with breakfast.   Yes Historical Provider, MD  tiZANidine (ZANAFLEX) 4 MG tablet Take 4 mg by mouth every 8 (eight) hours as needed for muscle spasms.   Yes Historical Provider, MD  moxifloxacin (AVELOX) 400 MG tablet Take 400 mg by mouth daily at 8 pm.    Historical Provider, MD   Allergies  Allergen Reactions  . Codeine Other (See Comments)    Passes out  . Morphine And Related Other (See Comments)    Patient assumes she is allergic to morphine because she is allergic to codeine  . Tramadol     Passes out  . Trazodone And Nefazodone Nausea And Vomiting    FAMILY HISTORY:  Family History  Problem Relation Age of Onset  . CAD Mother   . Peripheral vascular disease Mother   . CVA Father   . CAD Father   . COPD Brother   . Mitral valve prolapse Child   . Diabetes Child   . Mitral valve prolapse Child   . CAD Brother    SOCIAL HISTORY:  reports that she quit smoking about 8 months ago. Her smoking use included Cigarettes. She has a 67.5 pack-year smoking history. She does not have any smokeless tobacco history on file. She reports that she does not drink alcohol or use illicit drugs.  REVIEW OF SYSTEMS:  SEE HPI for pertinent positives.    SUBJECTIVE:   VITAL SIGNS: Temp:  [97.4 F (36.3 C)-100.9 F (38.3 C)] 100.6 F (38.1 C) (07/15 1315) Pulse Rate:  [51-103] 94 (07/15 1430) Resp:  [15-34] 15 (07/15 1430) BP: (71-91)/(38-54) 91/53 mmHg (07/15 1430) SpO2:  [100 %] 100 % (07/15 1430)  HEMODYNAMICS:    VENTILATOR SETTINGS:   INTAKE / OUTPUT: Intake/Output   None     PHYSICAL EXAMINATION: General:  ill appearing female in NAD   Neuro:  AO x 3, CN II-12 intact, normal coordination HEENT:  PERRLA, EOMs intact, oral dryness,  Cardiovascular:  RRR with no  M/R/G. Dorsalis pedis and Posterior tibialis +2 Lungs:  Crackles in lung bases bilaterally. No respiratory distress, 3L per [place] Abdomen: Soft, with no guarding, no rebound or masses. No hepatosplenomegaly. Tenderness to light palpation in the RLQ. Normoactive bowl sounds Musculoskeletal:  Tenderness to palpation in the sacral-lumbar paravertebral muscles.   Skin:  Skin is dry with diffuse bruising which is not new. Skin cancer removal site on left neck. Foot scabs on the dorsal surface.   LABS:  CBC  Recent Labs Lab 12/17/13 1055  WBC 0.6*  HGB 8.8*  HCT 26.2*  PLT 171   Coag's No results found  for this basename: APTT, INR,  in the last 168 hours BMET  Recent Labs Lab 12/17/13 1055  NA 128*  K 3.5*  CL 89*  CO2 21  BUN 30*  CREATININE 2.21*  GLUCOSE 127*   Electrolytes  Recent Labs Lab 12/17/13 1055  CALCIUM 7.6*   Sepsis Markers  Recent Labs Lab 12/17/13 1143  LATICACIDVEN 3.81*   ABG No results found for this basename: PHART, PCO2ART, PO2ART,  in the last 168 hours Liver Enzymes  Recent Labs Lab 12/17/13 1055  AST 38*  ALT 11  ALKPHOS 63  BILITOT 0.9  ALBUMIN 2.3*   Cardiac Enzymes  Recent Labs Lab 12/17/13 1055  TROPONINI <0.30  PROBNP 10581.0*   Glucose No results found for this basename: GLUCAP,  in the last 168 hours  Imaging Dg Chest Port 1 View  12/17/2013   CLINICAL DATA:  Hypertension. Weakness. History of lung cancer with radiation therapy.  EXAM: PORTABLE CHEST - 1 VIEW  COMPARISON:  CT 11/20/2013.  Radiographs 03/22/2013.  FINDINGS: 1123 hr. There are lower lung volumes with progressive coarse interstitial opacities throughout both lungs. There is probable superimposed bibasilar atelectasis. Patient is rotated to the right. Given this and the lower lung volumes, the heart size and mediastinal contours are grossly stable without progressive adenopathy. There is no pleural effusion or pneumothorax.  IMPRESSION: Much lower lung volumes  with probable atelectasis superimposed on chronic lung disease. Pulmonary edema and atypical inflammation cannot be excluded.   Electronically Signed   By: Camie Patience M.D.   On: 12/17/2013 11:38    ASSESSMENT / PLAN:  PULMONARY A: Hx Squamous cell lung cancer- s/p XRT Hx COPD Oxygen Dependent - baseline 3L  Tachypnea P:   Monitor RR  Albuterol PRN in setting of COPD Continue BREO CXR in am Pulmonary hygiene Continue baseline O2  CARDIOVASCULAR A:  Hypotension - responded to IVF, in setting of severe sepsis (urinary source) Hx Hyperlipidemia Hx HTN Hx CAD P:  Continue IVF for Hypotension Continue statin, ASA, zocor, fenofibrate HOLD home losartan  Stress steroids  RENAL A:   Acute Kidney Injury Hypokalemia Hyponatremia Hypochloremia Hypocalcemia Lactic Acidosis Hx Urinary Retention P:   Continue IVF for AKI, ensure adequate hydration Vanc and ZOSYN for UTI and sepsis Follow up CMP Avoid nephrotoxic agents Continue bethanechol  GASTROINTESTINAL A: Constipation Nausea / Vomiting GERD P:   PRN Senokot Diet as tolerated Continue home PPI  HEMATOLOGIC A:   Hx neutropenia Leukopenia Anemia P:  Follow up CBC Tx for hgb <7%  ENDOCRINE A:   Adrenal insufficiency  Mild Hyperglycemia   P:   Stress steroids Montitor glucose on bmp, if greater than 200, add SSI  NEUROLOGIC A:   Chronic Pain  P:   Hold home regimen See Below  MUSCULOSKELETAL A: Rheumatoid arthritis Osteoarthritis P: Continue zanaflex PRN, reduced dose Hold home prednisone  INFECTIOUS DISEASE A: Severe septic shock P: Follow cultures as above Vanco and ZOSYN as above  GLOBAL A: Code discussion P: FULL CODE  DERMATOLOGY A: Skin cancer- lesion removed from left neck recently P: Keep site clean / dry    Noe Gens, NP-C [place] [facility] Pgr: [phone] or [phone]  Admit to the ICU, hydrate, f/u lactic acid, no TLC placement for now.   Pan culture.  Broad spectrum abx.  Anticipate will improve with hydration and likely transfer out of the ICU in AM and back to [facility].  I have personally obtained a history, examined the patient,  evaluated laboratory and imaging results, formulated the assessment and plan and placed orders.  CRITICAL CARE: The patient is critically ill with multiple organ systems failure and requires high complexity decision making for assessment and support, frequent evaluation and titration of therapies, application of advanced monitoring technologies and extensive interpretation of multiple databases. Critical Care Time devoted to patient care services described in this note is 40 minutes.   Rush Farmer, M.D. [facility] Pulmonary/Critical Care Medicine. Pager: [phone]. After hours pager: [phone].  12/17/2013, 2:52 PM

## 2013-12-17 NOTE — ED Notes (Signed)
Attempted to call report.  Advised room was not ready. Would call me back.

## 2013-12-17 NOTE — Progress Notes (Signed)
ANTIBIOTIC CONSULT NOTE - INITIAL  Pharmacy Consult for Vancomycin and Zosyn Indication: sepsis  Allergies  Allergen Reactions  . Codeine Other (See Comments)    Passes out  . Morphine And Related Other (See Comments)    Patient assumes she is allergic to morphine because she is allergic to codeine  . Tramadol     Passes out  . Trazodone And Nefazodone Nausea And Vomiting    Patient Measurements:   Ht: 68 in    Wt: 128 lb  Vital Signs: Temp: 97.4 F (36.3 C) (07/15 1049) Temp src: Oral (07/15 1049) BP: 90/46 mmHg (07/15 1130) Pulse Rate: 101 (07/15 1130) Intake/Output from previous day:   Intake/Output from this shift:    Labs:  Recent Labs  12/17/13 1055  WBC 0.6*  HGB 8.8*  PLT 171   The CrCl is unknown because both a height and weight (above a minimum accepted value) are required for this calculation. No results found for this basename: VANCOTROUGH, VANCOPEAK, VANCORANDOM, GENTTROUGH, GENTPEAK, GENTRANDOM, TOBRATROUGH, TOBRAPEAK, TOBRARND, AMIKACINPEAK, AMIKACINTROU, AMIKACIN,  in the last 72 hours   Microbiology: No results found for this or any previous visit (from the past 720 hour(s)).  Medical History: Past Medical History  Diagnosis Date  . Tobacco abuse     Current smoker  . COPD (chronic obstructive pulmonary disease)   . Neutropenia, unspecified   . Rheumatoid arthritis   . Osteoarthritis   . Hyperlipidemia     On pravastatin  . Essential hypertension, benign     On Losartan  . Coronary atherosclerosis of native coronary artery     CT angiography 08/2012 showed significantly coronary calcification. On ASA. Stess Test 03/06/13 LV EF = 68%. High risk study due to ischemic dilatation & mid to distal anterior ischemia.  [name] History of radiation therapy 05/19/13, 05/21/2013, 05/23/2013, 05/26/2013    SBRT right lower lung nodule, 50 gray  . Cancer 09/12/12    Squamous cell lung cancer. RIGHT LOWER LOBE PNA.  . Skin cancer 12/16/13    removed from  left neck    Medications:  See electronic med rec  Assessment: 68 y.o. female presents with weakness x 2 days. Found to be hypotensive by EMS. To continue broad spectrum antibiotics. First doses of Zosyn 3.375gm and Vancomycin 1gm given in ED ~1230. LA 3.81. SCr 2.21, est CrCl ~20 ml/min. WBC 0.6. ANC pending. Cultures pending.  Goal of Therapy:  Vancomycin trough level 15-20 mcg/ml  Plan:  1) Zosyn 3.375gm IV q8h. Each dose over 8 hours 2) Vancomycin 500mg  IV q24h. 3) Will f/u renal function, micro data, pt's clinical condition 4) F/u new wt  Sherlon Handing, PharmD, BCPS Clinical pharmacist, pager [phone] 12/17/2013,12:39 PM

## 2013-12-18 ENCOUNTER — Inpatient Hospital Stay (HOSPITAL_COMMUNITY): Payer: Medicare Other

## 2013-12-18 LAB — BASIC METABOLIC PANEL
Anion gap: 18 — ABNORMAL HIGH (ref 5–15)
BUN: 19 mg/dL (ref 6–23)
CO2: 18 mEq/L — ABNORMAL LOW (ref 19–32)
Calcium: 7.4 mg/dL — ABNORMAL LOW (ref 8.4–10.5)
Chloride: 95 mEq/L — ABNORMAL LOW (ref 96–112)
Creatinine, Ser: 0.94 mg/dL (ref 0.50–1.10)
GFR calc Af Amer: 70 mL/min — ABNORMAL LOW (ref >90)
GFR, EST NON AFRICAN AMERICAN: 60 mL/min — AB (ref >90)
GLUCOSE: 175 mg/dL — AB (ref 70–99)
Potassium: 3.6 mEq/L — ABNORMAL LOW (ref 3.7–5.3)
Sodium: 131 mEq/L — ABNORMAL LOW (ref 137–147)

## 2013-12-18 LAB — URINE CULTURE
COLONY COUNT: NO GROWTH
Culture: NO GROWTH

## 2013-12-18 LAB — CBC
HCT: 29.9 % — ABNORMAL LOW (ref 36.0–46.0)
HEMOGLOBIN: 9.9 g/dL — AB (ref 12.0–15.0)
MCH: 28.4 pg (ref 26.0–34.0)
MCHC: 33.1 g/dL (ref 30.0–36.0)
MCV: 85.7 fL (ref 78.0–100.0)
Platelets: 184 10*3/uL (ref 150–400)
RBC: 3.49 MIL/uL — ABNORMAL LOW (ref 3.87–5.11)
RDW: 15.1 % (ref 11.5–15.5)
WBC: 0.4 10*3/uL — CL (ref 4.0–10.5)

## 2013-12-18 MED ORDER — PREDNISONE 10 MG PO TABS
10.0000 mg | ORAL_TABLET | Freq: Every day | ORAL | Status: DC
  Administered 2013-12-18 – 2013-12-24 (×7): 10 mg via ORAL
  Filled 2013-12-18 (×9): qty 1

## 2013-12-18 MED ORDER — CLONAZEPAM 0.5 MG PO TABS
1.0000 mg | ORAL_TABLET | Freq: Two times a day (BID) | ORAL | Status: DC | PRN
  Administered 2013-12-18 – 2013-12-21 (×5): 1 mg via ORAL
  Filled 2013-12-18 (×5): qty 2

## 2013-12-18 NOTE — Progress Notes (Addendum)
PULMONARY / CRITICAL CARE MEDICINE   Name: Olivia Nichols MRN: [id number] DOB: 07-14-1945    ADMISSION DATE:  12/17/2013 CONSULTATION DATE:  12/17/13  REFERRING MD :  EDP PRIMARY SERVICE: PCCM  CHIEF COMPLAINT:  Hypotension and weakness  BRIEF PATIENT DESCRIPTION: 68 y/o F, former smoker,  admitted 7/15 with severe sepsis with suspected urinary source & hypotension that responded to IVF.  PCCM consulted for admission.    PMH of SCCA of lung, O2 dependent COPD & RA (baseline 10 mg QD pred)  SIGNIFICANT EVENTS:  7/15 - Admit with severe sepsis, suspected urinary source  LINES / TUBES: PIVs  CULTURES: UA 7/15>> nitrite positive, small leukocytes, few bacteria, 3-6 WBC Blood 7/15 >> NGTD Urine 7/15 >>   ANTIBIOTICS: Vanco 7/15 >>  Zosyn 7/15 >>  SUBJECTIVE: Feeling much better this morning. Denies abdominal or back pain. Breathing comfortably. No dizziness.  VITAL SIGNS: Temp:  [99.5 F (37.5 C)-101.2 F (38.4 C)] 99.8 F (37.7 C) (07/16 0900) Pulse Rate:  [51-114] 107 (07/16 0900) Resp:  [15-34] 28 (07/16 0900) BP: (71-157)/(35-67) 107/67 mmHg (07/16 0800) SpO2:  [98 %-100 %] 98 % (07/16 0900) Weight:  [133 lb 6.1 oz (60.5 kg)-139 lb 15.9 oz (63.5 kg)] 139 lb 15.9 oz (63.5 kg) (07/16 0400)  HEMODYNAMICS:    VENTILATOR SETTINGS: 3LNC   INTAKE / OUTPUT: Intake/Output     07/15 0701 - 07/16 0700 07/16 0701 - 07/17 0700   P.O. 240    I.V. (mL/kg) 1350 (21.3) 150 (2.4)   IV Piggyback 1100    Total Intake(mL/kg) 2690 (42.4) 150 (2.4)   Urine (mL/kg/hr) 1465    Stool 1    Total Output 1466     Net +1224 +150          PHYSICAL EXAMINATION: General: chronically ill appearing female in NAD   Neuro:  AO x 3, no focal deficits noted HEENT:  Dry MM  Cardiovascular:  RRR with no M/R/G.  Lungs:  Crackles in lung bases bilaterally. Otherwise CTA. Normal WOB, 3L per [place]. No wheezes. Abdomen: Soft, with no guarding, no rebound or masses. No hepatosplenomegaly. Tenderness  to light palpation in the RLQ. Normoactive bowl sounds Musculoskeletal:  Tenderness to palpation in the sacral-lumbar paravertebral muscles.   Skin:  Skin is dry with diffuse bruising and breakdown which is not new. Skin cancer removal site on left neck. Foot scabs on the dorsal surfaces dressed  LABS:  CBC  Recent Labs Lab 12/17/13 1055  WBC 0.6*  HGB 8.8*  HCT 26.2*  PLT 171   Coag's No results found for this basename: APTT, INR,  in the last 168 hours BMET  Recent Labs Lab 12/17/13 1055  NA 128*  K 3.5*  CL 89*  CO2 21  BUN 30*  CREATININE 2.21*  GLUCOSE 127*   Electrolytes  Recent Labs Lab 12/17/13 1055  CALCIUM 7.6*   Sepsis Markers  Recent Labs Lab 12/17/13 1143 12/17/13 1506  LATICACIDVEN 3.81* 2.4*   ABG No results found for this basename: PHART, PCO2ART, PO2ART,  in the last 168 hours Liver Enzymes  Recent Labs Lab 12/17/13 1055  AST 38*  ALT 11  ALKPHOS 63  BILITOT 0.9  ALBUMIN 2.3*   Cardiac Enzymes  Recent Labs Lab 12/17/13 1055  TROPONINI <0.30  PROBNP 10581.0*   Glucose  Recent Labs Lab 12/17/13 1608  GLUCAP 131*    Imaging Reviewed  ASSESSMENT / PLAN:  PULMONARY A: Hx Squamous cell lung cancer- s/p XRT Hx  COPD Oxygen Dependent - baseline 3L  Tachypnea P:   Monitor RR  Albuterol PRN in setting of COPD Continue BREO CXR in am Pulmonary hygiene Continue baseline O2  CARDIOVASCULAR A:  Severe sepsis - responded to IVF (urinary source) Hx Hyperlipidemia Hx HTN Hx CAD P:  Continue IVF for Hypotension Continue statin, ASA, zocor, fenofibrate HOLD home losartan   RENAL A:   Acute Kidney Injury Hypokalemia Hyponatremia Hypochloremia Hypocalcemia Lactic Acidosis - resolving Hx Urinary Retention P:   Continue IVF for AKI, ensure adequate hydration Vanc and ZOSYN sepsis BMET now Avoid nephrotoxic agents Continue bethanechol  GASTROINTESTINAL A: Diarrhea Nausea / Vomiting GERD P:   PRN  Senokot Diet as tolerated Continue home PPI  HEMATOLOGIC A:   Hx chronic neutropenia - cyclic and unexplained (per pt & [facility] records in care everywhere) Leukopenia Anemia P:  Follow CBC Tx for hgb <7%  ENDOCRINE A:   Adrenal insufficiency  Mild Hyperglycemia   P:   Resume home dose steroids- 10 mg pred Monitor glucose on daily BMET  NEUROLOGIC A:   Chronic Pain  P:   Hold home regimen See Below  MUSCULOSKELETAL A: Rheumatoid arthritis Osteoarthritis P: Continue zanaflex PRN, reduced dose Resume home prednisone  INFECTIOUS DISEASE A: Severe septic shock - likely urinary source but GI or skin also possible P: Follow cultures as above Vanco and ZOSYN as above - narrow pending UCx, some concern for G pos organisms given skin breakdown   DERMATOLOGY A: Skin cancer- lesion removed from left neck recently P: Keep site clean / dry   FULL CODE  Beverlyn Roux, MD, MPH [facility] Family Medicine PGY-2  Care during the described time interval was provided by me and/or other providers on the critical care team.  I have reviewed this patient's available data, including medical history, events of note, physical examination and test results as part of my evaluation OK to transfer to floor & triad service in am   [facility] V.  MD 12/18/2013 11:10 AM

## 2013-12-18 NOTE — Clinical Documentation Improvement (Signed)
Possible Clinical Conditions?   Acute on Chronic Respiratory Failure Chronic Respiratory Failure   Cannot Clinically Determine   Supporting Information: H&P: O2 dependent COPD  O2 sats inpatient  98-100% on 3 liters/[place] Lungs: Crackles in lung bases bilaterally. No respiratory distress, 3L per [place]  Risk Factors:Tobacco abuse  Current smoker, COPD, HTN, Squamous cell lung cancer   Signs & Symptoms: Diagnostics:PORTABLE CHEST - 1 VIEW 12/17/2013 IMPRESSION: Much lower lung volumes with probable atelectasis superimposed on chronic lung disease. Pulmonary edema and atypical inflammation cannot be excluded   Treatment: Home dependent O2 @ 3Liters/[place]                   PROVENTIL inhaler     BREO  Pulmonary hygiene  Continue baseline O2     Thank You, Philippa Chester ,RN Clinical Documentation Specialist:  [place] Information Management

## 2013-12-19 ENCOUNTER — Inpatient Hospital Stay (HOSPITAL_COMMUNITY): Payer: Medicare Other

## 2013-12-19 DIAGNOSIS — N179 Acute kidney failure, unspecified: Secondary | ICD-10-CM

## 2013-12-19 DIAGNOSIS — E876 Hypokalemia: Secondary | ICD-10-CM

## 2013-12-19 DIAGNOSIS — C3490 Malignant neoplasm of unspecified part of unspecified bronchus or lung: Secondary | ICD-10-CM

## 2013-12-19 DIAGNOSIS — E871 Hypo-osmolality and hyponatremia: Secondary | ICD-10-CM

## 2013-12-19 DIAGNOSIS — I472 Ventricular tachycardia: Secondary | ICD-10-CM

## 2013-12-19 DIAGNOSIS — I10 Essential (primary) hypertension: Secondary | ICD-10-CM

## 2013-12-19 DIAGNOSIS — G894 Chronic pain syndrome: Secondary | ICD-10-CM

## 2013-12-19 DIAGNOSIS — A0472 Enterocolitis due to Clostridium difficile, not specified as recurrent: Secondary | ICD-10-CM

## 2013-12-19 DIAGNOSIS — A419 Sepsis, unspecified organism: Principal | ICD-10-CM

## 2013-12-19 DIAGNOSIS — R6520 Severe sepsis without septic shock: Secondary | ICD-10-CM

## 2013-12-19 DIAGNOSIS — E785 Hyperlipidemia, unspecified: Secondary | ICD-10-CM

## 2013-12-19 DIAGNOSIS — I4729 Other ventricular tachycardia: Secondary | ICD-10-CM

## 2013-12-19 LAB — PATHOLOGIST SMEAR REVIEW

## 2013-12-19 LAB — MAGNESIUM: MAGNESIUM: 1.8 mg/dL (ref 1.5–2.5)

## 2013-12-19 LAB — CLOSTRIDIUM DIFFICILE BY PCR: CDIFFPCR: POSITIVE — AB

## 2013-12-19 MED ORDER — POTASSIUM CHLORIDE 10 MEQ/100ML IV SOLN
10.0000 meq | INTRAVENOUS | Status: AC
  Administered 2013-12-19 – 2013-12-20 (×3): 10 meq via INTRAVENOUS
  Filled 2013-12-19 (×2): qty 100

## 2013-12-19 MED ORDER — IPRATROPIUM BROMIDE 0.02 % IN SOLN: 0.5000 mg | Freq: Four times a day (QID) | RESPIRATORY_TRACT | Status: DC

## 2013-12-19 MED ORDER — METRONIDAZOLE 500 MG PO TABS
500.0000 mg | ORAL_TABLET | Freq: Three times a day (TID) | ORAL | Status: DC
  Administered 2013-12-19 – 2013-12-24 (×14): 500 mg via ORAL
  Filled 2013-12-19 (×18): qty 1

## 2013-12-19 MED ORDER — LEVALBUTEROL HCL 1.25 MG/0.5ML IN NEBU
1.2500 mg | INHALATION_SOLUTION | Freq: Three times a day (TID) | RESPIRATORY_TRACT | Status: DC
  Administered 2013-12-19 – 2013-12-22 (×7): 1.25 mg via RESPIRATORY_TRACT
  Filled 2013-12-19 (×11): qty 0.5

## 2013-12-19 NOTE — Progress Notes (Deleted)
PULMONARY / CRITICAL CARE MEDICINE   Name: Olivia Nichols MRN: [id number] DOB: 08/16/1945    ADMISSION DATE:  12/17/2013 CONSULTATION DATE:  12/17/13  REFERRING MD :  EDP PRIMARY SERVICE: PCCM  CHIEF COMPLAINT:  Hypotension and weakness  BRIEF PATIENT DESCRIPTION: 68 y/o F, former smoker,  admitted 7/15 with severe sepsis with suspected urinary source & hypotension that responded to IVF.  PCCM consulted for admission.    PMH of SCCA of lung, O2 dependent COPD & RA (baseline 10 mg QD pred)  SIGNIFICANT EVENTS:  7/15 - Admit with severe sepsis, suspected urinary source  STUDIES:  CXR 7/15 >> Low lung volumes with probable atelectasis superimposed on chronic lung disease. Pulmonary edema and atypical inflammation cannot be excluded.  LINES / TUBES: PIVs  CULTURES: UA 7/15>> nitrite positive, small leukocytes, few bacteria, 3-6 WBC Blood 7/15 >> NGTD Urine 7/15 >> No growith  ANTIBIOTICS: Vanco 7/15 >>  Zosyn 7/15 >>  SUBJECTIVE: Feeling much better this morning. Denies abdominal or back pain. Breathing comfortably. No dizziness.  VITAL SIGNS: Temp:  [97.6 F (36.4 C)-99.8 F (37.7 C)] 98.8 F (37.1 C) (07/17 0454) Pulse Rate:  [97-108] 108 (07/17 0454) Resp:  [22-34] 34 (07/17 0454) BP: (94-131)/(53-68) 123/68 mmHg (07/17 0454) SpO2:  [98 %-100 %] 99 % (07/17 0454) Weight:  [149 lb 4 oz (67.7 kg)] 149 lb 4 oz (67.7 kg) (07/17 0500)  HEMODYNAMICS:    VENTILATOR SETTINGS: 3LNC   INTAKE / OUTPUT: Intake/Output     07/16 0701 - 07/17 0700 07/17 0701 - 07/18 0700   P.O.  120   I.V. (mL/kg) 637.5 (9.4)    IV Piggyback 150    Total Intake(mL/kg) 787.5 (11.6) 120 (1.8)   Urine (mL/kg/hr) 1200 (0.7)    Stool     Total Output 1200     Net -412.5 +120        Stool Occurrence 1 x      PHYSICAL EXAMINATION: General: chronically ill appearing female in NAD   Neuro:  AO x 3, no focal deficits noted HEENT:  Dry MM  Cardiovascular:  RRR with no M/R/G.  Lungs:   Crackles in lung bases bilaterally with diffuse wheezing. Otherwise CTA. Normal WOB, 3L per [place].  Abdomen: Soft, with no guarding, no rebound or masses. No hepatosplenomegaly. No Tenderness to palpation.  Normoactive bowl sounds Musculoskeletal:  No tenderness to palpation.    Skin:  Skin is dry with diffuse bruising and breakdown which is not new. Skin cancer removal site on left neck measuring 4cm. Foot scabs on the dorsal surfaces dressed  LABS:  CBC  Recent Labs Lab 12/17/13 1055 12/18/13 1145  WBC 0.6* 0.4*  HGB 8.8* 9.9*  HCT 26.2* 29.9*  PLT 171 184   Coag's No results found for this basename: APTT, INR,  in the last 168 hours BMET  Recent Labs Lab 12/17/13 1055 12/18/13 1145  NA 128* 131*  K 3.5* 3.6*  CL 89* 95*  CO2 21 18*  BUN 30* 19  CREATININE 2.21* 0.94  GLUCOSE 127* 175*   Electrolytes  Recent Labs Lab 12/17/13 1055 12/18/13 1145  CALCIUM 7.6* 7.4*   Sepsis Markers  Recent Labs Lab 12/17/13 1143 12/17/13 1506  LATICACIDVEN 3.81* 2.4*   ABG No results found for this basename: PHART, PCO2ART, PO2ART,  in the last 168 hours Liver Enzymes  Recent Labs Lab 12/17/13 1055  AST 38*  ALT 11  ALKPHOS 63  BILITOT 0.9  ALBUMIN 2.3*   Cardiac  Enzymes  Recent Labs Lab 12/17/13 1055  TROPONINI <0.30  PROBNP 10581.0*   Glucose  Recent Labs Lab 12/17/13 1608  GLUCAP 131*    Imaging Reviewed  ASSESSMENT / PLAN:  PULMONARY A: Hx Squamous cell lung cancer- s/p XRT Hx COPD Oxygen Dependent - baseline 3L  Tachypnea P:   Monitor RR  Albuterol PRN in setting of COPD Continue BREO Pulmonary hygiene Continue baseline O2  CARDIOVASCULAR A:  Severe sepsis - responded to IVF Tachycardia Hx Hyperlipidemia Hx HTN Hx CAD P:  Continue IVF for Hypotension Continue statin, ASA, zocor, fenofibrate HOLD home losartan  Monitor HR  RENAL A:   Acute Kidney Injury Hypokalemia Hyponatremia Hypochloremia Hypocalcemia Lactic  Acidosis - resolving Hx Urinary Retention P:   Continue IVF for AKI, ensure adequate hydration Vanc and ZOSYN sepsis BMET now Avoid nephrotoxic agents Continue bethanechol  GASTROINTESTINAL A: Diarrhea- 7/17 resolved Nausea / Vomiting- 7/16 resolved GERD P:   PRN Senokot Diet as tolerated Continue home PPI  HEMATOLOGIC A:   Hx chronic neutropenia - cyclic and unexplained (per pt & [facility] records in care everywhere) Leukopenia Anemia P:  Follow CBC Tx for hgb <7%  ENDOCRINE A:   Adrenal insufficiency  Mild Hyperglycemia   P:   Resume home dose steroids- 10 mg pred Monitor glucose on daily BMET  NEUROLOGIC A:   Chronic Pain  P:   Hold home regimen See Below  MUSCULOSKELETAL A: Rheumatoid arthritis Osteoarthritis P: Continue zanaflex PRN, reduced dose Resume home prednisone  INFECTIOUS DISEASE A: Severe septic shock - likely urinary source, GI or skin also possible P: Follow cultures as above Vanco and ZOSYN as above - narrow pending UCx, some concern for G pos organisms given skin breakdown   DERMATOLOGY A: Skin cancer- lesion removed from left neck recently P: Keep site clean / dry   FULL CODE    Dominga Ferry  MD 12/19/2013 8:44 AM

## 2013-12-19 NOTE — Progress Notes (Signed)
[facility] TEAM 1 - Stepdown/ICU TEAM Progress Note  Olivia Nichols JKK:[id number] DOB: [birth year] DOA: 12/17/2013 PCP: Fae Pippin  Admit HPI / Brief Narrative: 68 y/o WF, PMHx former smoker (3 PPD x35 years), anxiety, HLD, HTN, Squamous cell cancer right lower lung (SCC) active being treated with stereotactic body radiotherapy, O2 dependent COPD & RA (baseline 10 mg QD pred) admitted 7/15 with severe sepsis with suspected urinary source & hypotension that responded to IVF.     HPI/Subjective: 7/17 A./O. x4, NAD, negative CP/SOB   Assessment/Plan:  Squamous cell lung cancer- s/p XRT  -Baseline O2 demands equal 3 L via [place] , currently on her home regimen of 3 L O2 via [place] -Unsure if oncology is aware patient has been admitted will contact oncology in a.m. -Start Xopenex nebulizerTID -Continue Symbicort 160-4.5 mcg BID in place of BREO (fluticasone+ Vilanterol) -Flutter valve  q 4hr while    Severe sepsis (urosepsis) -Continue normal saline at 25ml/hr  -Continue antibiotics for 7 days  Tachycardia  -Continued episodes of tachycardia, hopefully will resolve as patient was status improves.  Hyponatremia -Improving with hydration, continue monitor  Hypokalemia -Replete with potassium 10 mEq x3 runs -Recheck in a.m.  HTN -Currently within [facility] guidelines (actually on the soft side) hold all BP medication  CAD - HEART score= 4+ -Obtain echocardiogram  Hyperlipidemia  -Continue Zocor 20 mg daily, -Obtain lipid panel  Acute Kidney Injury  -Resolved -Continue hydration -Avoid nephrotoxic medication  Lactic Acidosis  -Check in a.m.   Diarrhea - 7/17 resolved  -7/17 C. difficile by PCR positive,  Clostridium difficile colitis -Start metronidazole PO mouth 500 mg TID   Hx chronic neutropenia/leukopenia  - cyclic and unexplained (per pt & [facility] records in care everywhere)   Anemia  -Continue to follow currently stable  Adrenal insufficiency  -Secondary to  chronic steroid use we'll continue patient's home dose steroids 10 mg of prednisone daily  Mild Hyperglycemia  -Most likely iatrogenic from the chronic steroid use will obtain hemoglobin A1c  Chronic Pain  -Secondary to rheumatoid arthritis/osteoarthritis  -Continue zanaflex PRN, reduced dose    Code Status: FULL Family Communication: no family present at time of exam Disposition Plan:    Consultants: Dr. Kara Mead (PCCM)    Procedure/Significant Events: 7/15 PCXR; Much lower lung volumes with probable atelectasis superimposed on chronic lung disease. Pulmonary edema and atypical inflammation cannot be excluded     Culture UA 7/15>> nitrite positive, small leukocytes, few bacteria, 3-6 WBC  Blood 7/15 right arm/left forearm>> NGTD  Urine 7/15 >> No growth 7/15 MRSA by PCR negative 7/17 Clostridium difficile by PCR positive   Antibiotics: Vanco 7/15 >>  Zosyn 7/15 >>   DVT prophylaxis: Heparin subcutaneous   Devices NA   LINES / TUBES:  7/15 20ga right antecubital 7/17 20ga left forearm     Continuous Infusions: . sodium chloride 75 mL/hr at 12/19/13 0200    Objective: VITAL SIGNS: Temp: 98.5 F (36.9 C) (07/17 0800) Temp src: Oral (07/17 0800) BP: 137/61 mmHg (07/17 0800) Pulse Rate: 111 (07/17 0800) SPO2; FIO2:   Intake/Output Summary (Last 24 hours) at 12/19/13 0958 Last data filed at 12/19/13 0800  Gross per 24 hour  Intake  757.5 ml  Output   1201 ml  Net -443.5 ml     Exam: General: A./O. x4, No acute respiratory distress Lungs: Clear to auscultation bilaterally without wheezes or crackles Cardiovascular: Regular rate and rhythm without murmur gallop or rub normal S1 and S2 Abdomen:  Nontender, nondistended, soft, bowel sounds positive, no rebound, no ascites, no appreciable mass Extremities: No significant cyanosis, clubbing, or edema bilateral lower extremities  Data Reviewed: Basic Metabolic Panel:  Recent Labs Lab  12/17/13 1055 12/18/13 1145  NA 128* 131*  K 3.5* 3.6*  CL 89* 95*  CO2 21 18*  GLUCOSE 127* 175*  BUN 30* 19  CREATININE 2.21* 0.94  CALCIUM 7.6* 7.4*   Liver Function Tests:  Recent Labs Lab 12/17/13 1055  AST 38*  ALT 11  ALKPHOS 63  BILITOT 0.9  PROT 5.5*  ALBUMIN 2.3*   No results found for this basename: LIPASE, AMYLASE,  in the last 168 hours No results found for this basename: AMMONIA,  in the last 168 hours CBC:  Recent Labs Lab 12/17/13 1055 12/18/13 1145  WBC 0.6* 0.4*  NEUTROABS 0.2*  --   HGB 8.8* 9.9*  HCT 26.2* 29.9*  MCV 83.7 85.7  PLT 171 184   Cardiac Enzymes:  Recent Labs Lab 12/17/13 1055  TROPONINI <0.30   BNP (last 3 results)  Recent Labs  12/17/13 1055  PROBNP 10581.0*   CBG:  Recent Labs Lab 12/17/13 1608  GLUCAP 131*    Recent Results (from the past 240 hour(s))  CULTURE, BLOOD (ROUTINE X 2)     Status: None   Collection Time    12/17/13 11:30 AM      Result Value Ref Range Status   Specimen Description BLOOD BLOOD RIGHT FOREARM   Final   Special Requests BOTTLES DRAWN AEROBIC AND ANAEROBIC 5CCS   Final   Culture  Setup Time     Final   Value: 12/17/2013 16:52     Performed at [facility]   Culture     Final   Value:        BLOOD CULTURE RECEIVED NO GROWTH TO DATE CULTURE WILL BE HELD FOR 5 DAYS BEFORE ISSUING A FINAL NEGATIVE REPORT     Performed at [facility]   Report Status PENDING   Incomplete  CULTURE, BLOOD (ROUTINE X 2)     Status: None   Collection Time    12/17/13 11:38 AM      Result Value Ref Range Status   Specimen Description BLOOD BLOOD LEFT FOREARM   Final   Special Requests BOTTLES DRAWN AEROBIC AND ANAEROBIC 5CC   Final   Culture  Setup Time     Final   Value: 12/17/2013 16:52     Performed at [facility]   Culture     Final   Value:        BLOOD CULTURE RECEIVED NO GROWTH TO DATE CULTURE WILL BE HELD FOR 5 DAYS BEFORE ISSUING A FINAL NEGATIVE REPORT      Performed at [facility]   Report Status PENDING   Incomplete  URINE CULTURE     Status: None   Collection Time    12/17/13 12:41 PM      Result Value Ref Range Status   Specimen Description URINE, CATHETERIZED   Final   Special Requests NONE   Final   Culture  Setup Time     Final   Value: 12/17/2013 16:59     Performed at [place]     Final   Value: NO GROWTH     Performed at [facility]   Culture     Final   Value: NO GROWTH     Performed at [facility]   Report Status 12/18/2013 FINAL   Final  MRSA PCR SCREENING     Status: None   Collection Time    12/17/13  4:12 PM      Result Value Ref Range Status   MRSA by PCR NEGATIVE  NEGATIVE Final   Comment:            The GeneXpert MRSA Assay (FDA     approved for NASAL specimens     only), is one component of a     comprehensive MRSA colonization     surveillance program. It is not     intended to diagnose MRSA     infection nor to guide or     monitor treatment for     MRSA infections.     Studies:  Recent x-ray studies have been reviewed in detail by the Attending Physician  Scheduled Meds:  Scheduled Meds: . bethanechol  25 mg Oral TID  . budesonide-formoterol  2 puff Inhalation BID  . heparin  5,000 Units Subcutaneous 3 times per day  . pantoprazole  40 mg Oral Daily  . piperacillin-tazobactam (ZOSYN)  IV  3.375 g Intravenous 3 times per day  . predniSONE  10 mg Oral Q breakfast  . simvastatin  20 mg Oral q1800  . vancomycin  500 mg Intravenous Q24H    Time spent on care of this patient: 40 mins   Allie Bossier , MD   [facility] Office  [phone] Pager [phone]  On-Call/Text Page:      [email or web address]      password [email or web address]  If 7PM-7AM, please contact night-coverage [email or web address] Password [email or web address] 12/19/2013, 9:58 AM   LOS: 2 days

## 2013-12-20 DIAGNOSIS — R072 Precordial pain: Secondary | ICD-10-CM

## 2013-12-20 DIAGNOSIS — J438 Other emphysema: Secondary | ICD-10-CM

## 2013-12-20 LAB — COMPREHENSIVE METABOLIC PANEL
ALT: 12 U/L (ref 0–35)
ANION GAP: 12 (ref 5–15)
AST: 16 U/L (ref 0–37)
Albumin: 1.8 g/dL — ABNORMAL LOW (ref 3.5–5.2)
Alkaline Phosphatase: 54 U/L (ref 39–117)
BILIRUBIN TOTAL: 0.4 mg/dL (ref 0.3–1.2)
BUN: 8 mg/dL (ref 6–23)
CHLORIDE: 98 meq/L (ref 96–112)
CO2: 25 meq/L (ref 19–32)
CREATININE: 0.68 mg/dL (ref 0.50–1.10)
Calcium: 7.8 mg/dL — ABNORMAL LOW (ref 8.4–10.5)
GFR calc Af Amer: >90 mL/min (ref >90)
GFR, EST NON AFRICAN AMERICAN: 87 mL/min — AB (ref >90)
Glucose, Bld: 87 mg/dL (ref 70–99)
Potassium: 3.6 mEq/L — ABNORMAL LOW (ref 3.7–5.3)
Sodium: 135 mEq/L — ABNORMAL LOW (ref 137–147)
Total Protein: 5.3 g/dL — ABNORMAL LOW (ref 6.0–8.3)

## 2013-12-20 LAB — CBC WITH DIFFERENTIAL/PLATELET
BASOS ABS: 0 10*3/uL (ref 0.0–0.1)
Basophils Relative: 1 % (ref 0–1)
Eosinophils Absolute: 0 10*3/uL (ref 0.0–0.7)
Eosinophils Relative: 3 % (ref 0–5)
HCT: 29.6 % — ABNORMAL LOW (ref 36.0–46.0)
Hemoglobin: 9.6 g/dL — ABNORMAL LOW (ref 12.0–15.0)
LYMPHS ABS: 0.4 10*3/uL — AB (ref 0.7–4.0)
LYMPHS PCT: 31 % (ref 12–46)
MCH: 27.6 pg (ref 26.0–34.0)
MCHC: 32.4 g/dL (ref 30.0–36.0)
MCV: 85.1 fL (ref 78.0–100.0)
MONOS PCT: 24 % — AB (ref 3–12)
Monocytes Absolute: 0.3 10*3/uL (ref 0.1–1.0)
Neutro Abs: 0.5 10*3/uL — ABNORMAL LOW (ref 1.7–7.7)
Neutrophils Relative %: 41 % — ABNORMAL LOW (ref 43–77)
PLATELETS: 222 10*3/uL (ref 150–400)
RBC: 3.48 MIL/uL — ABNORMAL LOW (ref 3.87–5.11)
RDW: 15.2 % (ref 11.5–15.5)
WBC: 1.2 10*3/uL — AB (ref 4.0–10.5)

## 2013-12-20 LAB — HEMOGLOBIN A1C
HEMOGLOBIN A1C: 5.6 % (ref <5.7)
Mean Plasma Glucose: 114 mg/dL (ref <117)

## 2013-12-20 LAB — LIPID PANEL
CHOL/HDL RATIO: 19 ratio
Cholesterol: 95 mg/dL (ref 0–200)
HDL: 5 mg/dL — AB (ref >39)
LDL CALC: 38 mg/dL (ref 0–99)
Triglycerides: 261 mg/dL — ABNORMAL HIGH (ref <150)
VLDL: 52 mg/dL — AB (ref 0–40)

## 2013-12-20 LAB — MAGNESIUM: Magnesium: 1.7 mg/dL (ref 1.5–2.5)

## 2013-12-20 LAB — LACTIC ACID, PLASMA: LACTIC ACID, VENOUS: 1.4 mmol/L (ref 0.5–2.2)

## 2013-12-20 NOTE — Progress Notes (Signed)
  Echocardiogram 2D Echocardiogram has been performed.  Mauricio Po 12/20/2013, 11:39 AM

## 2013-12-20 NOTE — Progress Notes (Signed)
ANTIBIOTIC CONSULT NOTE - INITIAL  Pharmacy Consult for Vancomycin and Zosyn Indication: sepsis  Allergies  Allergen Reactions  . Codeine Other (See Comments)    Passes out  . Morphine And Related Other (See Comments)    Patient assumes she is allergic to morphine because she is allergic to codeine  . Tramadol     Passes out  . Trazodone And Nefazodone Nausea And Vomiting    Patient Measurements: Height: 5\' 8"  (172.7 cm) Weight: 149 lb 4 oz (67.7 kg) IBW/kg (Calculated) : 63.9 Ht: 68 in    Wt: 128 lb  Vital Signs: Temp: 97.9 F (36.6 C) (07/18 1223) Temp src: Oral (07/18 1223) BP: 117/53 mmHg (07/18 1223) Pulse Rate: 96 (07/18 1223) Intake/Output from previous day: 07/17 0701 - 07/18 0700 In: 1625 [P.O.:300; I.V.:1275; IV Piggyback:50] Out: 9509 [TOIZT:2458; Stool:1] Intake/Output from this shift: Total I/O In: -  Out: 275 [Urine:275]  Labs:  Recent Labs  12/18/13 1145 12/20/13 0240  WBC 0.4* 1.2*  HGB 9.9* 9.6*  PLT 184 222  CREATININE 0.94 0.68   Estimated Creatinine Clearance: 66 ml/min (by C-G formula based on Cr of 0.68). No results found for this basename: VANCOTROUGH, VANCOPEAK, VANCORANDOM, [place], [place], GENTRANDOM, TOBRATROUGH, TOBRAPEAK, TOBRARND, AMIKACINPEAK, AMIKACINTROU, AMIKACIN,  in the last 72 hours   Microbiology: Recent Results (from the past 720 hour(s))  CULTURE, BLOOD (ROUTINE X 2)     Status: None   Collection Time    12/17/13 11:30 AM      Result Value Ref Range Status   Specimen Description BLOOD BLOOD RIGHT FOREARM   Final   Special Requests BOTTLES DRAWN AEROBIC AND ANAEROBIC 5CCS   Final   Culture  Setup Time     Final   Value: 12/17/2013 16:52     Performed at [facility]   Culture     Final   Value:        BLOOD CULTURE RECEIVED NO GROWTH TO DATE CULTURE WILL BE HELD FOR 5 DAYS BEFORE ISSUING A FINAL NEGATIVE REPORT     Performed at [facility]   Report Status PENDING   Incomplete  CULTURE, BLOOD  (ROUTINE X 2)     Status: None   Collection Time    12/17/13 11:38 AM      Result Value Ref Range Status   Specimen Description BLOOD BLOOD LEFT FOREARM   Final   Special Requests BOTTLES DRAWN AEROBIC AND ANAEROBIC 5CC   Final   Culture  Setup Time     Final   Value: 12/17/2013 16:52     Performed at [facility]   Culture     Final   Value:        BLOOD CULTURE RECEIVED NO GROWTH TO DATE CULTURE WILL BE HELD FOR 5 DAYS BEFORE ISSUING A FINAL NEGATIVE REPORT     Performed at [facility]   Report Status PENDING   Incomplete  URINE CULTURE     Status: None   Collection Time    12/17/13 12:41 PM      Result Value Ref Range Status   Specimen Description URINE, CATHETERIZED   Final   Special Requests NONE   Final   Culture  Setup Time     Final   Value: 12/17/2013 16:59     Performed at [place]     Final   Value: NO GROWTH     Performed at [facility]  Culture     Final   Value: NO GROWTH     Performed at [facility]   Report Status 12/18/2013 FINAL   Final  MRSA PCR SCREENING     Status: None   Collection Time    12/17/13  4:12 PM      Result Value Ref Range Status   MRSA by PCR NEGATIVE  NEGATIVE Final   Comment:            The GeneXpert MRSA Assay (FDA     approved for NASAL specimens     only), is one component of a     comprehensive MRSA colonization     surveillance program. It is not     intended to diagnose MRSA     infection nor to guide or     monitor treatment for     MRSA infections.  CLOSTRIDIUM DIFFICILE BY PCR     Status: Abnormal   Collection Time    12/19/13  1:34 PM      Result Value Ref Range Status   C difficile by pcr POSITIVE (*) NEGATIVE Final   Comment: CRITICAL RESULT CALLED TO, READ BACK BY AND VERIFIED WITH:     RONCALLO RN 15:20 12/19/13 ([email or web address])    Medical History: Past Medical History  Diagnosis Date  . Tobacco abuse     Current smoker  . COPD (chronic obstructive  pulmonary disease)   . Neutropenia, unspecified   . Rheumatoid arthritis   . Osteoarthritis   . Hyperlipidemia     On pravastatin  . Essential hypertension, benign     On Losartan  . Coronary atherosclerosis of native coronary artery     CT angiography 08/2012 showed significantly coronary calcification. On ASA. Stess Test 03/06/13 LV EF = 68%. High risk study due to ischemic dilatation & mid to distal anterior ischemia.  [name] History of radiation therapy 05/19/13, 05/21/2013, 05/23/2013, 05/26/2013    SBRT right lower lung nodule, 50 gray  . Cancer 09/12/12    Squamous cell lung cancer. RIGHT LOWER LOBE PNA.  . Skin cancer 12/16/13    removed from left neck    Medications:  See electronic med rec  Assessment: 68 y.o. female presents with weakness x 2 days. Found to be hypotensive by EMS. On day #4 broad spectrum antibiotics for r/o sepsis. SCr has improved significantly and est CrCl now ~55 ml/min. Pt remains afebrile, WBC low at 1.2. Patient was also started on Flagyl on 7/17 for POS Cdiff PCR.  Vanc 7/15 > Zosyn 7/15 > Flagyl 7/17>  7/15 bld x2 >ngtd 7/15 urine > NEG 7/7 Cdiff PCR>POS   Goal of Therapy:  Vancomycin trough level 15-20 mcg/ml  Plan:  - Increase vancomycin dose to 500 mg IV q12h with improvement in renal function - Continue Zosyn 3.375 gm IV q8h (4 hr infusion) - Continue Flagyl 500 mg PO TID per MD - Monitor renal function, C&S, temp, WBC, de-escalation  Harolyn Rutherford, PharmD Clinical Pharmacist - Resident Pager: [phone] Pharmacy: [phone] 12/20/2013 3:01 PM

## 2013-12-20 NOTE — Progress Notes (Signed)
[place] TEAM 1 - Stepdown/ICU TEAM Progress Note  Olivia Nichols KDT:[id number] DOB: August 24, 1945 DOA: 12/17/2013 PCP: Fae Pippin  Admit HPI / Brief Narrative: 68 y/o WF, PMHx former smoker (3 PPD x35 years), anxiety, HLD, HTN, Squamous cell cancer right lower lung (SCC) active being treated with stereotactic body radiotherapy, O2 dependent COPD & RA (baseline 10 mg QD pred) admitted 7/15 with severe sepsis with suspected urinary source & hypotension that responded to IVF.     HPI/Subjective: 7/18 A./O. x4, NAD, negative CP/SOB, insistent she wants to be discharged today.   Assessment/Plan:  Squamous cell lung cancer- s/p XRT  -Baseline O2 demands equal 3 L via [place] , currently on her home regimen of 3 L O2 via [place] -7/18 notified Oncology that patient has been admitted. -Continue  Xopenex nebulizerTID -Continue Symbicort 160-4.5 mcg BID in place of BREO (fluticasone+ Vilanterol) -Flutter valve  q 4hr while    Severe sepsis (urosepsis) -Continue normal saline at 19ml/hr  -Continue antibiotics for 5 days, secondary to patient's immunocompromised state.  Tachycardia  -Resolved with hydration.  Hyponatremia -Improving with hydration, continue monitor  Hypokalemia -Replete with potassium 10 mEq x3 runs -Recheck in a.m.  HTN -Currently within [facility] guidelines (actually on the soft side) hold all BP medication  CAD - HEART score= 4+ -Obtain echocardiogram  Hyperlipidemia  -Continue Zocor 20 mg daily, -Obtain lipid panel  Acute Kidney Injury  -Resolved -Continue hydration -Avoid nephrotoxic medication  Lactic Acidosis  -Check in a.m.   Diarrhea - 7/17 resolved  -7/17 C. difficile by PCR positive,  Clostridium difficile colitis -Start metronidazole PO mouth 500 mg TID   Hx chronic neutropenia/leukopenia  - cyclic and unexplained (per pt & [facility] records in care everywhere)   Anemia  -Continue to follow currently stable  Adrenal insufficiency  -Secondary  to chronic steroid use will continue patient's home dose steroids 10 mg of prednisone daily  Mild Hyperglycemia  -Most likely iatrogenic from the chronic steroid use will obtain hemoglobin A1c  Chronic Pain  -Secondary to rheumatoid arthritis/osteoarthritis  -Continue zanaflex PRN, reduced dose    Code Status: FULL Family Communication: no family present at time of exam Disposition Plan:    Consultants: Dr. Kara Mead (PCCM)    Procedure/Significant Events: 7/15 PCXR; Much lower lung volumes with probable atelectasis superimposed on chronic lung disease. Pulmonary edema and atypical inflammation cannot be excluded 7/80 echocardiogram - LVEF= 60%-to 65%.  - Pulmonary arteries: PA peak pressure: 32 mm Hg (S).     Culture UA 7/15>> nitrite positive, small leukocytes, few bacteria, 3-6 WBC  Blood 7/15 right arm/left forearm>> NGTD  Urine 7/15 >> No growth 7/15 MRSA by PCR negative 7/17 Clostridium difficile by PCR positive   Antibiotics: Vanco 7/15 >>  Zosyn 7/15 >> Metronidazole 7/17>>   DVT prophylaxis: Heparin subcutaneous   Devices NA   LINES / TUBES:  7/15 20ga right antecubital 7/17 20ga left forearm     Continuous Infusions: . sodium chloride 75 mL/hr at 12/20/13 0612    Objective: VITAL SIGNS: Temp: 97.9 F (36.6 C) (07/18 1223) Temp src: Oral (07/18 1223) BP: 117/53 mmHg (07/18 1223) Pulse Rate: 96 (07/18 1223) SPO2; FIO2:   Intake/Output Summary (Last 24 hours) at 12/20/13 1442 Last data filed at 12/20/13 1223  Gross per 24 hour  Intake   1455 ml  Output   1650 ml  Net   -195 ml     Exam: General: A./O. x4, No acute respiratory distress Lungs: Poor air movement  in all lung fields, positive diffuse expiratory wheezing, positive increased E./I. ratio  Cardiovascular: Regular rate and rhythm without murmur gallop or rub normal S1 and S2 Abdomen: Nontender, nondistended, soft, bowel sounds positive, no rebound, no ascites, no  appreciable mass Extremities: No significant cyanosis, clubbing, or edema bilateral lower extremities  Data Reviewed: Basic Metabolic Panel:  Recent Labs Lab 12/17/13 1055 12/18/13 1145 12/19/13 2026 12/20/13 0240  NA 128* 131*  --  135*  K 3.5* 3.6*  --  3.6*  CL 89* 95*  --  98  CO2 21 18*  --  25  GLUCOSE 127* 175*  --  87  BUN 30* 19  --  8  CREATININE 2.21* 0.94  --  0.68  CALCIUM 7.6* 7.4*  --  7.8*  MG  --   --  1.8 1.7   Liver Function Tests:  Recent Labs Lab 12/17/13 1055 12/20/13 0240  AST 38* 16  ALT 11 12  ALKPHOS 63 54  BILITOT 0.9 0.4  PROT 5.5* 5.3*  ALBUMIN 2.3* 1.8*   No results found for this basename: LIPASE, AMYLASE,  in the last 168 hours No results found for this basename: AMMONIA,  in the last 168 hours CBC:  Recent Labs Lab 12/17/13 1055 12/18/13 1145 12/20/13 0240  WBC 0.6* 0.4* 1.2*  NEUTROABS 0.2*  --  0.5*  HGB 8.8* 9.9* 9.6*  HCT 26.2* 29.9* 29.6*  MCV 83.7 85.7 85.1  PLT 171 184 222   Cardiac Enzymes:  Recent Labs Lab 12/17/13 1055  TROPONINI <0.30   BNP (last 3 results)  Recent Labs  12/17/13 1055  PROBNP 10581.0*   CBG:  Recent Labs Lab 12/17/13 1608  GLUCAP 131*    Recent Results (from the past 240 hour(s))  CULTURE, BLOOD (ROUTINE X 2)     Status: None   Collection Time    12/17/13 11:30 AM      Result Value Ref Range Status   Specimen Description BLOOD BLOOD RIGHT FOREARM   Final   Special Requests BOTTLES DRAWN AEROBIC AND ANAEROBIC 5CCS   Final   Culture  Setup Time     Final   Value: 12/17/2013 16:52     Performed at [facility]   Culture     Final   Value:        BLOOD CULTURE RECEIVED NO GROWTH TO DATE CULTURE WILL BE HELD FOR 5 DAYS BEFORE ISSUING A FINAL NEGATIVE REPORT     Performed at [facility]   Report Status PENDING   Incomplete  CULTURE, BLOOD (ROUTINE X 2)     Status: None   Collection Time    12/17/13 11:38 AM      Result Value Ref Range Status   Specimen  Description BLOOD BLOOD LEFT FOREARM   Final   Special Requests BOTTLES DRAWN AEROBIC AND ANAEROBIC 5CC   Final   Culture  Setup Time     Final   Value: 12/17/2013 16:52     Performed at [facility]   Culture     Final   Value:        BLOOD CULTURE RECEIVED NO GROWTH TO DATE CULTURE WILL BE HELD FOR 5 DAYS BEFORE ISSUING A FINAL NEGATIVE REPORT     Performed at [facility]   Report Status PENDING   Incomplete  URINE CULTURE     Status: None   Collection Time    12/17/13 12:41 PM  Result Value Ref Range Status   Specimen Description URINE, CATHETERIZED   Final   Special Requests NONE   Final   Culture  Setup Time     Final   Value: 12/17/2013 16:59     Performed at [place]     Final   Value: NO GROWTH     Performed at [facility]   Culture     Final   Value: NO GROWTH     Performed at [facility]   Report Status 12/18/2013 FINAL   Final  MRSA PCR SCREENING     Status: None   Collection Time    12/17/13  4:12 PM      Result Value Ref Range Status   MRSA by PCR NEGATIVE  NEGATIVE Final   Comment:            The GeneXpert MRSA Assay (FDA     approved for NASAL specimens     only), is one component of a     comprehensive MRSA colonization     surveillance program. It is not     intended to diagnose MRSA     infection nor to guide or     monitor treatment for     MRSA infections.  CLOSTRIDIUM DIFFICILE BY PCR     Status: Abnormal   Collection Time    12/19/13  1:34 PM      Result Value Ref Range Status   C difficile by pcr POSITIVE (*) NEGATIVE Final   Comment: CRITICAL RESULT CALLED TO, READ BACK BY AND VERIFIED WITH:     RONCALLO RN 15:20 12/19/13 ([email or web address])     Studies:  Recent x-ray studies have been reviewed in detail by the Attending Physician  Scheduled Meds:  Scheduled Meds: . bethanechol  25 mg Oral TID  . budesonide-formoterol  2 puff Inhalation BID  . heparin  5,000 Units Subcutaneous 3  times per day  . levalbuterol  1.25 mg Nebulization 3 times per day  . metroNIDAZOLE  500 mg Oral 3 times per day  . pantoprazole  40 mg Oral Daily  . piperacillin-tazobactam (ZOSYN)  IV  3.375 g Intravenous 3 times per day  . predniSONE  10 mg Oral Q breakfast  . simvastatin  20 mg Oral q1800  . vancomycin  500 mg Intravenous Q24H    Time spent on care of this patient: 40 mins   Allie Bossier , MD   [facility] Office  [phone] Pager [phone]  On-Call/Text Page:      [email or web address]      password [email or web address]  If 7PM-7AM, please contact night-coverage [email or web address] Password [email or web address] 12/20/2013, 2:42 PM   LOS: 3 days

## 2013-12-21 ENCOUNTER — Inpatient Hospital Stay (HOSPITAL_COMMUNITY): Payer: Medicare Other

## 2013-12-21 DIAGNOSIS — R339 Retention of urine, unspecified: Secondary | ICD-10-CM

## 2013-12-21 LAB — CBC WITH DIFFERENTIAL/PLATELET
Basophils Absolute: 0 10*3/uL (ref 0.0–0.1)
Basophils Relative: 0 % (ref 0–1)
EOS PCT: 2 % (ref 0–5)
Eosinophils Absolute: 0 10*3/uL (ref 0.0–0.7)
HCT: 27.1 % — ABNORMAL LOW (ref 36.0–46.0)
HEMOGLOBIN: 8.8 g/dL — AB (ref 12.0–15.0)
LYMPHS ABS: 0.5 10*3/uL — AB (ref 0.7–4.0)
Lymphocytes Relative: 29 % (ref 12–46)
MCH: 28.1 pg (ref 26.0–34.0)
MCHC: 32.5 g/dL (ref 30.0–36.0)
MCV: 86.6 fL (ref 78.0–100.0)
Monocytes Absolute: 0.2 10*3/uL (ref 0.1–1.0)
Monocytes Relative: 15 % — ABNORMAL HIGH (ref 3–12)
NEUTROS PCT: 54 % (ref 43–77)
Neutro Abs: 0.9 10*3/uL — ABNORMAL LOW (ref 1.7–7.7)
PLATELETS: 239 10*3/uL (ref 150–400)
RBC: 3.13 MIL/uL — AB (ref 3.87–5.11)
RDW: 15.4 % (ref 11.5–15.5)
WBC: 1.6 10*3/uL — AB (ref 4.0–10.5)

## 2013-12-21 LAB — COMPREHENSIVE METABOLIC PANEL
ALT: 9 U/L (ref 0–35)
AST: 13 U/L (ref 0–37)
Albumin: 1.7 g/dL — ABNORMAL LOW (ref 3.5–5.2)
Alkaline Phosphatase: 47 U/L (ref 39–117)
Anion gap: 13 (ref 5–15)
BUN: 5 mg/dL — ABNORMAL LOW (ref 6–23)
CO2: 22 meq/L (ref 19–32)
CREATININE: 0.62 mg/dL (ref 0.50–1.10)
Calcium: 7.7 mg/dL — ABNORMAL LOW (ref 8.4–10.5)
Chloride: 101 mEq/L (ref 96–112)
GFR calc Af Amer: >90 mL/min (ref >90)
GFR, EST NON AFRICAN AMERICAN: 89 mL/min — AB (ref >90)
Glucose, Bld: 86 mg/dL (ref 70–99)
Potassium: 3.2 mEq/L — ABNORMAL LOW (ref 3.7–5.3)
Sodium: 136 mEq/L — ABNORMAL LOW (ref 137–147)
TOTAL PROTEIN: 4.9 g/dL — AB (ref 6.0–8.3)
Total Bilirubin: 0.3 mg/dL (ref 0.3–1.2)

## 2013-12-21 LAB — MAGNESIUM: Magnesium: 1.6 mg/dL (ref 1.5–2.5)

## 2013-12-21 MED ORDER — BETHANECHOL CHLORIDE 25 MG PO TABS
50.0000 mg | ORAL_TABLET | Freq: Three times a day (TID) | ORAL | Status: DC
  Administered 2013-12-21 – 2013-12-24 (×9): 50 mg via ORAL
  Filled 2013-12-21 (×11): qty 2

## 2013-12-21 MED ORDER — POTASSIUM CHLORIDE CRYS ER 20 MEQ PO TBCR
40.0000 meq | EXTENDED_RELEASE_TABLET | Freq: Two times a day (BID) | ORAL | Status: DC
  Administered 2013-12-21: 40 meq via ORAL

## 2013-12-21 MED ORDER — POTASSIUM CHLORIDE CRYS ER 20 MEQ PO TBCR
40.0000 meq | EXTENDED_RELEASE_TABLET | Freq: Every day | ORAL | Status: DC
  Administered 2013-12-21 – 2013-12-24 (×4): 40 meq via ORAL
  Filled 2013-12-21 (×4): qty 2

## 2013-12-21 MED ORDER — BETHANECHOL CHLORIDE 25 MG PO TABS
25.0000 mg | ORAL_TABLET | Freq: Once | ORAL | Status: AC
  Administered 2013-12-21: 25 mg via ORAL
  Filled 2013-12-21: qty 1

## 2013-12-21 MED ORDER — VANCOMYCIN HCL 500 MG IV SOLR
500.0000 mg | Freq: Two times a day (BID) | INTRAVENOUS | Status: DC
  Administered 2013-12-21: 500 mg via INTRAVENOUS
  Filled 2013-12-21 (×3): qty 500

## 2013-12-21 MED ORDER — CLONAZEPAM 0.5 MG PO TABS: 0.5000 mg | ORAL_TABLET | Freq: Two times a day (BID) | ORAL | Status: DC | PRN

## 2013-12-21 MED ORDER — SIMVASTATIN 40 MG PO TABS
40.0000 mg | ORAL_TABLET | Freq: Every day | ORAL | Status: DC
  Administered 2013-12-21 – 2013-12-23 (×3): 40 mg via ORAL
  Filled 2013-12-21 (×4): qty 1

## 2013-12-21 MED ORDER — LISINOPRIL 2.5 MG PO TABS
2.5000 mg | ORAL_TABLET | Freq: Every day | ORAL | Status: DC
  Administered 2013-12-21 – 2013-12-22 (×2): 2.5 mg via ORAL
  Filled 2013-12-21 (×3): qty 1

## 2013-12-21 NOTE — Progress Notes (Signed)
TEAM 1 - Stepdown/ICU TEAM Progress Note  Olivia Nichols ZOX:[id number] DOB: 17-Aug-1945 DOA: 12/17/2013 PCP: Fae Pippin  Admit HPI / Brief Narrative: 68 y/o WF, PMHx former smoker (3 PPD x35 years), anxiety, HLD, HTN, Squamous cell cancer right lower lung (SCC) active being treated with stereotactic body radiotherapy, O2 dependent COPD & RA (baseline 10 mg QD pred) admitted 7/15 with severe sepsis with suspected urinary source & hypotension that responded to IVF.     HPI/Subjective: 7/19 A./O. x4, NAD, negative CP/SOB, insistent she wants to be discharged today.   Assessment/Plan:  Squamous cell lung cancer- s/p XRT  -Baseline O2 demands equal 3 L via [place] , currently on her home regimen of 3 L O2 via [place] -7/18 notified Oncology that patient has been admitted. -Continue  Xopenex nebulizerTID -Continue Symbicort 160-4.5 mcg BID in place of BREO (fluticasone+ Vilanterol) -Flutter valve  q 4hr while    Severe sepsis (urosepsis) -Continue normal saline at 16ml/hr  -Continue antibiotics for 5 days, secondary to patient's immunocompromised state.  Urinary retention chronic -Patient has been having chronic urinary retention was started on bethanechol by PCP several weeks ago. Appears to be only marginally effective as patient has been retaining urine during this admission. -Increase bethanechol to 50 mg TID -Decrease Klonopin to 0.5 mg BID. Can cause/exacerbate urinary retention -Patient with a story of back injury and several weeks of urinary retention obtain L-spine (patient with squamous cell cancer). If unable to void by the a.m. and L-spine x-ray negative may need to consult urology  Tachycardia  -Resolved with hydration.  Hyponatremia -Improving with hydration, continue monitor  Hypokalemia -K-Dur 40 mEq BID  HTN -Not within AHA guidelines start lisinopril 2.5 mg daily   CAD - HEART score= 4+ -Echocardiogram; within normal limits see results  below  Hyperlipidemia  -Triglycerides high at 261, although statins not the best medication to control triglycerides will first increase Zocor to 40 mg daily before adding additional agent  Acute Kidney Injury  -Resolved -Continue hydration -Avoid nephrotoxic medication  Lactic Acidosis  -Within normal limit  Diarrhea - 7/17 resolved  -7/17 C. difficile  positive, by PCR  Clostridium difficile colitis -Continue metronidazole PO mouth 500 mg TID   Hx chronic neutropenia/leukopenia  - cyclic and unexplained (per pt & [facility] records in care everywhere)   Anemia  -Continue to follow currently stable  Adrenal insufficiency  -Secondary to chronic steroid use will continue patient's home dose steroids 10 mg of prednisone daily  Mild Hyperglycemia  -Most likely iatrogenic from the chronic steroid -7/18 hemoglobin A1c= 5.6  Chronic Pain  -Secondary to rheumatoid arthritis/osteoarthritis  -Continue zanaflex PRN, reduced dose   Urinary retention/lower back pain      Code Status: FULL Family Communication: no family present at time of exam Disposition Plan:    Consultants: Dr. Kara Mead (PCCM)    Procedure/Significant Events: 7/15 PCXR; Much lower lung volumes with probable atelectasis superimposed on chronic lung disease. Pulmonary edema and atypical inflammation cannot be excluded 7/80 echocardiogram - LVEF= 60%-to 65%.  - Pulmonary arteries: PA peak pressure: 32 mm Hg (S).     Culture UA 7/15>> nitrite positive, small leukocytes, few bacteria, 3-6 WBC  Blood 7/15 right arm/left forearm>> NGTD  Urine 7/15 >> No growth 7/15 MRSA by PCR negative 7/17 Clostridium difficile by PCR positive   Antibiotics: Vanco 7/15 >>  Zosyn 7/15 >> Metronidazole 7/17>>   DVT prophylaxis: Heparin subcutaneous   Devices NA   LINES / TUBES:  7/15 20ga right antecubital 7/17 20ga left forearm     Continuous Infusions: . sodium chloride 1,000 mL (12/21/13 0345)     Objective: VITAL SIGNS: Temp: 97.7 F (36.5 C) (07/19 0800) Temp src: Oral (07/19 0800) BP: 169/77 mmHg (07/19 0800) Pulse Rate: 106 (07/19 0800) SPO2; FIO2:   Intake/Output Summary (Last 24 hours) at 12/21/13 1229 Last data filed at 12/21/13 1000  Gross per 24 hour  Intake   2225 ml  Output    701 ml  Net   1524 ml     Exam: General: A./O. x4, No acute respiratory distress Lungs: Poor air movement in all lung fields, negative wheezing, continue increased E./I. ratio  Cardiovascular: Regular rate and rhythm without murmur gallop or rub normal S1 and S2 Abdomen: Nontender, nondistended, soft, bowel sounds positive, no rebound, no ascites, no appreciable mass Extremities: No significant cyanosis, clubbing, or edema bilateral lower extremities  Data Reviewed: Basic Metabolic Panel:  Recent Labs Lab 12/17/13 1055 12/18/13 1145 12/19/13 2026 12/20/13 0240 12/21/13 0247  NA 128* 131*  --  135* 136*  K 3.5* 3.6*  --  3.6* 3.2*  CL 89* 95*  --  98 101  CO2 21 18*  --  25 22  GLUCOSE 127* 175*  --  87 86  BUN 30* 19  --  8 5*  CREATININE 2.21* 0.94  --  0.68 0.62  CALCIUM 7.6* 7.4*  --  7.8* 7.7*  MG  --   --  1.8 1.7 1.6   Liver Function Tests:  Recent Labs Lab 12/17/13 1055 12/20/13 0240 12/21/13 0247  AST 38* 16 13  ALT 11 12 9   ALKPHOS 63 54 47  BILITOT 0.9 0.4 0.3  PROT 5.5* 5.3* 4.9*  ALBUMIN 2.3* 1.8* 1.7*   No results found for this basename: LIPASE, AMYLASE,  in the last 168 hours No results found for this basename: AMMONIA,  in the last 168 hours CBC:  Recent Labs Lab 12/17/13 1055 12/18/13 1145 12/20/13 0240 12/21/13 0247  WBC 0.6* 0.4* 1.2* 1.6*  NEUTROABS 0.2*  --  0.5* 0.9*  HGB 8.8* 9.9* 9.6* 8.8*  HCT 26.2* 29.9* 29.6* 27.1*  MCV 83.7 85.7 85.1 86.6  PLT 171 184 222 239   Cardiac Enzymes:  Recent Labs Lab 12/17/13 1055  TROPONINI <0.30   BNP (last 3 results)  Recent Labs  12/17/13 1055  PROBNP 10581.0*    CBG:  Recent Labs Lab 12/17/13 1608  GLUCAP 131*    Recent Results (from the past 240 hour(s))  CULTURE, BLOOD (ROUTINE X 2)     Status: None   Collection Time    12/17/13 11:30 AM      Result Value Ref Range Status   Specimen Description BLOOD BLOOD RIGHT FOREARM   Final   Special Requests BOTTLES DRAWN AEROBIC AND ANAEROBIC 5CCS   Final   Culture  Setup Time     Final   Value: 12/17/2013 16:52     Performed at [facility]   Culture     Final   Value:        BLOOD CULTURE RECEIVED NO GROWTH TO DATE CULTURE WILL BE HELD FOR 5 DAYS BEFORE ISSUING A FINAL NEGATIVE REPORT     Performed at [facility]   Report Status PENDING   Incomplete  CULTURE, BLOOD (ROUTINE X 2)     Status: None   Collection Time    12/17/13 11:38 AM      Result  Value Ref Range Status   Specimen Description BLOOD BLOOD LEFT FOREARM   Final   Special Requests BOTTLES DRAWN AEROBIC AND ANAEROBIC 5CC   Final   Culture  Setup Time     Final   Value: 12/17/2013 16:52     Performed at [facility]   Culture     Final   Value:        BLOOD CULTURE RECEIVED NO GROWTH TO DATE CULTURE WILL BE HELD FOR 5 DAYS BEFORE ISSUING A FINAL NEGATIVE REPORT     Performed at [facility]   Report Status PENDING   Incomplete  URINE CULTURE     Status: None   Collection Time    12/17/13 12:41 PM      Result Value Ref Range Status   Specimen Description URINE, CATHETERIZED   Final   Special Requests NONE   Final   Culture  Setup Time     Final   Value: 12/17/2013 16:59     Performed at [place]     Final   Value: NO GROWTH     Performed at [facility]   Culture     Final   Value: NO GROWTH     Performed at [facility]   Report Status 12/18/2013 FINAL   Final  MRSA PCR SCREENING     Status: None   Collection Time    12/17/13  4:12 PM      Result Value Ref Range Status   MRSA by PCR NEGATIVE  NEGATIVE Final   Comment:            The  GeneXpert MRSA Assay (FDA     approved for NASAL specimens     only), is one component of a     comprehensive MRSA colonization     surveillance program. It is not     intended to diagnose MRSA     infection nor to guide or     monitor treatment for     MRSA infections.  CLOSTRIDIUM DIFFICILE BY PCR     Status: Abnormal   Collection Time    12/19/13  1:34 PM      Result Value Ref Range Status   C difficile by pcr POSITIVE (*) NEGATIVE Final   Comment: CRITICAL RESULT CALLED TO, READ BACK BY AND VERIFIED WITH:     RONCALLO RN 15:20 12/19/13 ([email or web address])     Studies:  Recent x-ray studies have been reviewed in detail by the Attending Physician  Scheduled Meds:  Scheduled Meds: . bethanechol  25 mg Oral TID  . budesonide-formoterol  2 puff Inhalation BID  . heparin  5,000 Units Subcutaneous 3 times per day  . levalbuterol  1.25 mg Nebulization 3 times per day  . metroNIDAZOLE  500 mg Oral 3 times per day  . pantoprazole  40 mg Oral Daily  . piperacillin-tazobactam (ZOSYN)  IV  3.375 g Intravenous 3 times per day  . predniSONE  10 mg Oral Q breakfast  . simvastatin  20 mg Oral q1800  . vancomycin  500 mg Intravenous Q24H    Time spent on care of this patient: 40 mins   Allie Bossier , MD   [facility] Office  [phone] Pager [phone]  On-Call/Text Page:      [email or web address]      password [email or web address]  If 7PM-7AM, please contact night-coverage [email or web address] Password [facility] 12/21/2013, 12:29 PM   LOS:  4 days

## 2013-12-21 NOTE — Progress Notes (Signed)
Pt ambulated to br to urinate but was unable. Dr Sherral Hammers made aware

## 2013-12-21 NOTE — Progress Notes (Signed)
Pt had foley cath removed at 1600 yesterday. Had 1 large incontinent episode(urine) during the night. This am c/o not being able to urinate. Bladder scan showed> 400cc's. Pt up to [facility] but still unable to void. Pt straight cathed for 500 cc's clear amber urine.

## 2013-12-22 LAB — CBC WITH DIFFERENTIAL/PLATELET
BASOS PCT: 0 % (ref 0–1)
Basophils Absolute: 0 10*3/uL (ref 0.0–0.1)
EOS ABS: 0.1 10*3/uL (ref 0.0–0.7)
Eosinophils Relative: 2 % (ref 0–5)
HEMATOCRIT: 29.8 % — AB (ref 36.0–46.0)
HEMOGLOBIN: 9.6 g/dL — AB (ref 12.0–15.0)
Lymphocytes Relative: 25 % (ref 12–46)
Lymphs Abs: 0.9 10*3/uL (ref 0.7–4.0)
MCH: 28.1 pg (ref 26.0–34.0)
MCHC: 32.2 g/dL (ref 30.0–36.0)
MCV: 87.1 fL (ref 78.0–100.0)
MONO ABS: 0.3 10*3/uL (ref 0.1–1.0)
MONOS PCT: 9 % (ref 3–12)
NEUTROS ABS: 2.2 10*3/uL (ref 1.7–7.7)
Neutrophils Relative %: 64 % (ref 43–77)
Platelets: 277 10*3/uL (ref 150–400)
RBC: 3.42 MIL/uL — ABNORMAL LOW (ref 3.87–5.11)
RDW: 15.7 % — ABNORMAL HIGH (ref 11.5–15.5)
WBC: 3.4 10*3/uL — ABNORMAL LOW (ref 4.0–10.5)

## 2013-12-22 LAB — CLOSTRIDIUM DIFFICILE BY PCR: Toxigenic C. Difficile by PCR: POSITIVE — AB

## 2013-12-22 LAB — COMPREHENSIVE METABOLIC PANEL
ALBUMIN: 1.7 g/dL — AB (ref 3.5–5.2)
ALK PHOS: 47 U/L (ref 39–117)
ALT: 8 U/L (ref 0–35)
AST: 18 U/L (ref 0–37)
Anion gap: 12 (ref 5–15)
BUN: 3 mg/dL — ABNORMAL LOW (ref 6–23)
CO2: 24 mEq/L (ref 19–32)
Calcium: 7.6 mg/dL — ABNORMAL LOW (ref 8.4–10.5)
Chloride: 105 mEq/L (ref 96–112)
Creatinine, Ser: 0.54 mg/dL (ref 0.50–1.10)
GFR calc Af Amer: >90 mL/min (ref >90)
GFR calc non Af Amer: >90 mL/min (ref >90)
Glucose, Bld: 80 mg/dL (ref 70–99)
POTASSIUM: 3.3 meq/L — AB (ref 3.7–5.3)
SODIUM: 141 meq/L (ref 137–147)
TOTAL PROTEIN: 4.8 g/dL — AB (ref 6.0–8.3)
Total Bilirubin: 0.2 mg/dL — ABNORMAL LOW (ref 0.3–1.2)

## 2013-12-22 LAB — MAGNESIUM: Magnesium: 1.5 mg/dL (ref 1.5–2.5)

## 2013-12-22 MED ORDER — FAMOTIDINE 20 MG PO TABS
20.0000 mg | ORAL_TABLET | Freq: Every day | ORAL | Status: DC
  Administered 2013-12-22: 20 mg via ORAL
  Filled 2013-12-22: qty 1

## 2013-12-22 MED ORDER — ACETAMINOPHEN 500 MG PO TABS: 500.0000 mg | ORAL_TABLET | Freq: Four times a day (QID) | ORAL | Status: DC | PRN

## 2013-12-22 MED ORDER — AMITRIPTYLINE HCL 25 MG PO TABS
25.0000 mg | ORAL_TABLET | Freq: Every day | ORAL | Status: DC
  Administered 2013-12-22 – 2013-12-23 (×2): 25 mg via ORAL
  Filled 2013-12-22 (×3): qty 1

## 2013-12-22 MED ORDER — FLUCONAZOLE 150 MG PO TABS
150.0000 mg | ORAL_TABLET | Freq: Once | ORAL | Status: AC
  Administered 2013-12-22: 150 mg via ORAL
  Filled 2013-12-22 (×2): qty 1

## 2013-12-22 NOTE — Progress Notes (Signed)
[place] TEAM 1 - Stepdown/ICU TEAM Progress Note  Olivia Nichols ATF:[id number] DOB: January 12, 1946 DOA: 12/17/2013 PCP: Fae Pippin  Admit HPI / Brief Narrative: 68 y/o F former smoker (3 PPD x35 years) w/ hx of anxiety, HLD, HTN, Squamous cell cancer right lower lung actively being treated with stereotactic radiotherapy, O2 dependent COPD & RA (baseline 10 mg QD pred) admitted 7/15 with severe sepsis with suspected urinary source & hypotension that responded to IVF.   HPI/Subjective: Resting comfortably today.  Desires to be d/c home as soon as possible.  Denies new complaints.  Has not been able to urinate today, with bladder scans revealing >400cc retained urine.    Assessment/Plan:  Squamous cell lung cancer - s/p XRT  -currently on her home regimen of 3 L O2 via [place] -notified Oncology that patient has been admitted  Severe sepsis (urosepsis) -Completed 5 days of antibiotics, secondary to patient's immunocompromised state  C diff Diarrhea -C. difficile positive by PCR - diarrhea continues - will plan for 10 days of tx, w/ 7/20 being first day free from other abx - stopped protonix   Urinary retention chronic -Patient has been having chronic urinary retention - started on bethanechol by PCP several weeks ago -appears to have been only marginally effective as patient has been retaining urine during this admission -Increased bethanechol to 50 mg TID -Decreased Klonopin to 0.5 mg BID as it can cause/exacerbate urinary retention -L-spine XRays without acute findings to explain retention  Hyponatremia -resolved w/ volume resuscitation   Hypokalemia -continue to replete and follow   HTN -not yet at goal - meds have been adjusted - follow w/o change today   CAD -no c/o chest pain  -Echocardiogram; within normal limits see results below  Hyperlipidemia  -Triglycerides high at 261 - increase Zocor to 40 mg daily before adding additional agent  Acute Kidney Injury    -Resolved w/ hydration -Avoid nephrotoxic medication  Lactic Acidosis  -resolved   Hx chronic neutropenia/leukopenia  -cyclic and unexplained (per pt & [facility] records in care everywhere)   Anemia  -Continue to follow currently stable  Adrenal insufficiency  -Secondary to chronic steroid use - continue patient's home dose steroids 10 mg of prednisone daily  Mild Hyperglycemia  -Most likely iatrogenic from the chronic steroid -hemoglobin A1c= 5.6  Chronic Pain  -Secondary to rheumatoid arthritis/osteoarthritis  -Continue zanaflex PRN, reduced dose   Code Status: FULL Family Communication: spoke w/ daughters at bedside at length Disposition Plan: stable for transfer to medical bed - PT/OT - follow urinary retention   Consultants: Dr. Kara Mead (PCCM)   Procedure/Significant Events: 7/80 echocardiogram - LVEF= 60%-to 65%.  - Pulmonary arteries: PA peak pressure: 32 mm Hg (S).  Positive Culture Data: UA 7/15>> nitrite positive, small leukocytes, few bacteria, 3-6 WBC  7/17 Clostridium difficile by PCR positive  Antibiotics: Vanco 7/15 >> 7/19 Zosyn 7/15 >>7/19 Metronidazole 7/17>>  DVT prophylaxis: Heparin subcutaneous  Objective: VITAL SIGNS: Blood pressure 151/81, pulse 92, temperature 97.5 F (36.4 C), temperature source Oral, resp. rate 31, height 5\' 8"  (1.727 m), weight 69.6 kg (153 lb 7 oz), SpO2 100.00%.  Intake/Output Summary (Last 24 hours) at 12/22/13 1135 Last data filed at 12/22/13 1000  Gross per 24 hour  Intake   3075 ml  Output      3 ml  Net   3072 ml   Exam: General: No acute respiratory distress Lungs: Poor air movement in all lung fields, negative wheezing  Cardiovascular: Regular rate and  rhythm without murmur gallop or rub normal S1 and S2 Abdomen: Nontender, nondistended, soft, bowel sounds positive, no rebound, no ascites, no appreciable mass Extremities: No significant cyanosis, clubbing, or edema bilateral lower extremities  Data  Reviewed: Basic Metabolic Panel:  Recent Labs Lab 12/17/13 1055 12/18/13 1145 12/19/13 2026 12/20/13 0240 12/21/13 0247 12/22/13 0303  NA 128* 131*  --  135* 136* 141  K 3.5* 3.6*  --  3.6* 3.2* 3.3*  CL 89* 95*  --  98 101 105  CO2 21 18*  --  25 22 24   GLUCOSE 127* 175*  --  87 86 80  BUN 30* 19  --  8 5* 3*  CREATININE 2.21* 0.94  --  0.68 0.62 0.54  CALCIUM 7.6* 7.4*  --  7.8* 7.7* 7.6*  MG  --   --  1.8 1.7 1.6 1.5   Liver Function Tests:  Recent Labs Lab 12/17/13 1055 12/20/13 0240 12/21/13 0247 12/22/13 0303  AST 38* 16 13 18   ALT 11 12 9 8   ALKPHOS 63 54 47 47  BILITOT 0.9 0.4 0.3 0.2*  PROT 5.5* 5.3* 4.9* 4.8*  ALBUMIN 2.3* 1.8* 1.7* 1.7*   CBC:  Recent Labs Lab 12/17/13 1055 12/18/13 1145 12/20/13 0240 12/21/13 0247 12/22/13 0303  WBC 0.6* 0.4* 1.2* 1.6* 3.4*  NEUTROABS 0.2*  --  0.5* 0.9* 2.2  HGB 8.8* 9.9* 9.6* 8.8* 9.6*  HCT 26.2* 29.9* 29.6* 27.1* 29.8*  MCV 83.7 85.7 85.1 86.6 87.1  PLT 171 184 222 239 277   CBG:  Recent Labs Lab 12/17/13 1608  GLUCAP 131*    Recent Results (from the past 240 hour(s))  CULTURE, BLOOD (ROUTINE X 2)     Status: None   Collection Time    12/17/13 11:30 AM      Result Value Ref Range Status   Specimen Description BLOOD BLOOD RIGHT FOREARM   Final   Special Requests BOTTLES DRAWN AEROBIC AND ANAEROBIC 5CCS   Final   Culture  Setup Time     Final   Value: 12/17/2013 16:52     Performed at [facility]   Culture     Final   Value:        BLOOD CULTURE RECEIVED NO GROWTH TO DATE CULTURE WILL BE HELD FOR 5 DAYS BEFORE ISSUING A FINAL NEGATIVE REPORT     Performed at [facility]   Report Status PENDING   Incomplete  CULTURE, BLOOD (ROUTINE X 2)     Status: None   Collection Time    12/17/13 11:38 AM      Result Value Ref Range Status   Specimen Description BLOOD BLOOD LEFT FOREARM   Final   Special Requests BOTTLES DRAWN AEROBIC AND ANAEROBIC 5CC   Final   Culture  Setup Time      Final   Value: 12/17/2013 16:52     Performed at [facility]   Culture     Final   Value:        BLOOD CULTURE RECEIVED NO GROWTH TO DATE CULTURE WILL BE HELD FOR 5 DAYS BEFORE ISSUING A FINAL NEGATIVE REPORT     Performed at [facility]   Report Status PENDING   Incomplete  URINE CULTURE     Status: None   Collection Time    12/17/13 12:41 PM      Result Value Ref Range Status   Specimen Description URINE, CATHETERIZED   Final   Special Requests  NONE   Final   Culture  Setup Time     Final   Value: 12/17/2013 16:59     Performed at [place]     Final   Value: NO GROWTH     Performed at [facility]   Culture     Final   Value: NO GROWTH     Performed at [facility]   Report Status 12/18/2013 FINAL   Final  MRSA PCR SCREENING     Status: None   Collection Time    12/17/13  4:12 PM      Result Value Ref Range Status   MRSA by PCR NEGATIVE  NEGATIVE Final   Comment:            The GeneXpert MRSA Assay (FDA     approved for NASAL specimens     only), is one component of a     comprehensive MRSA colonization     surveillance program. It is not     intended to diagnose MRSA     infection nor to guide or     monitor treatment for     MRSA infections.  CLOSTRIDIUM DIFFICILE BY PCR     Status: Abnormal   Collection Time    12/19/13  1:34 PM      Result Value Ref Range Status   C difficile by pcr POSITIVE (*) NEGATIVE Final   Comment: CRITICAL RESULT CALLED TO, READ BACK BY AND VERIFIED WITH:     RONCALLO RN 15:20 12/19/13 ([email or web address])  CLOSTRIDIUM DIFFICILE BY PCR     Status: Abnormal   Collection Time    12/22/13 12:29 AM      Result Value Ref Range Status   C difficile by pcr POSITIVE (*) NEGATIVE Final   Comment: CRITICAL RESULT CALLED TO, READ BACK BY AND VERIFIED WITH:     RONCALLO RN 11:10 12/22/13 ([email or web address])     Studies:  Recent x-ray studies have been reviewed in detail by the Attending  Physician  Scheduled Meds:  Scheduled Meds: . bethanechol  50 mg Oral TID  . budesonide-formoterol  2 puff Inhalation BID  . heparin  5,000 Units Subcutaneous 3 times per day  . levalbuterol  1.25 mg Nebulization 3 times per day  . lisinopril  2.5 mg Oral Daily  . metroNIDAZOLE  500 mg Oral 3 times per day  . pantoprazole  40 mg Oral Daily  . piperacillin-tazobactam (ZOSYN)  IV  3.375 g Intravenous 3 times per day  . potassium chloride  40 mEq Oral Daily  . predniSONE  10 mg Oral Q breakfast  . simvastatin  40 mg Oral q1800  . vancomycin  500 mg Intravenous Q12H    Time spent on care of this patient: 35 mins  Cherene Altes, MD [facility] For Consults/Admissions - Flow Manager - [phone] Office  [phone] Pager [phone]  On-Call/Text Page:      [email or web address]      password [facility]  12/22/2013, 11:35 AM   LOS: 5 days

## 2013-12-22 NOTE — Progress Notes (Signed)
Pt transferred to the unit at 1530. Pt mental status is A&Ox4. Pt oriented to room, staff, and call bell. Call bell within reach. Visitor guidelines reviewed w/ pt and/or family.

## 2013-12-23 DIAGNOSIS — N390 Urinary tract infection, site not specified: Secondary | ICD-10-CM

## 2013-12-23 LAB — CULTURE, BLOOD (ROUTINE X 2)
CULTURE: NO GROWTH
Culture: NO GROWTH

## 2013-12-23 LAB — BASIC METABOLIC PANEL
Anion gap: 8 (ref 5–15)
CHLORIDE: 108 meq/L (ref 96–112)
CO2: 24 mEq/L (ref 19–32)
Calcium: 7.7 mg/dL — ABNORMAL LOW (ref 8.4–10.5)
Creatinine, Ser: 0.53 mg/dL (ref 0.50–1.10)
GFR calc non Af Amer: >90 mL/min (ref >90)
Glucose, Bld: 85 mg/dL (ref 70–99)
Potassium: 3.2 mEq/L — ABNORMAL LOW (ref 3.7–5.3)
Sodium: 140 mEq/L (ref 137–147)

## 2013-12-23 LAB — CBC
HCT: 33.3 % — ABNORMAL LOW (ref 36.0–46.0)
Hemoglobin: 10.4 g/dL — ABNORMAL LOW (ref 12.0–15.0)
MCH: 27.4 pg (ref 26.0–34.0)
MCHC: 31.2 g/dL (ref 30.0–36.0)
MCV: 87.6 fL (ref 78.0–100.0)
PLATELETS: 291 10*3/uL (ref 150–400)
RBC: 3.8 MIL/uL — AB (ref 3.87–5.11)
RDW: 15.7 % — ABNORMAL HIGH (ref 11.5–15.5)
WBC: 4.6 10*3/uL (ref 4.0–10.5)

## 2013-12-23 MED ORDER — HYDRALAZINE HCL 20 MG/ML IJ SOLN: 5.0000 mg | Freq: Four times a day (QID) | INTRAMUSCULAR | Status: DC | PRN

## 2013-12-23 MED ORDER — LISINOPRIL 10 MG PO TABS
10.0000 mg | ORAL_TABLET | Freq: Every day | ORAL | Status: DC
  Administered 2013-12-24: 10 mg via ORAL
  Filled 2013-12-23 (×2): qty 1

## 2013-12-23 MED ORDER — POTASSIUM CHLORIDE CRYS ER 20 MEQ PO TBCR
20.0000 meq | EXTENDED_RELEASE_TABLET | Freq: Two times a day (BID) | ORAL | Status: DC
  Administered 2013-12-23 (×2): 20 meq via ORAL
  Filled 2013-12-23 (×4): qty 1

## 2013-12-23 MED ORDER — HYDRALAZINE HCL 20 MG/ML IJ SOLN
10.0000 mg | Freq: Four times a day (QID) | INTRAMUSCULAR | Status: DC | PRN
  Administered 2013-12-23: 10 mg via INTRAVENOUS
  Filled 2013-12-23: qty 1

## 2013-12-23 NOTE — Care Management Note (Signed)
    Page 1 of 2   12/24/2013     5:09:47 PM CARE MANAGEMENT NOTE 12/24/2013  Patient:  Olivia Nichols, Olivia Nichols   Account Number:  [id number]  Date Initiated:  12/18/2013  Documentation initiated by:  Luz Lex  Subjective/Objective Assessment:   Admitted with sepsis     Action/Plan:   pt rec hhpt with 24 hr   Anticipated DC Date:  12/24/2013   Anticipated DC Plan:  [place]  CM consult      [facility] Choice  HOME HEALTH   Choice offered to / List presented to:  [name]        [place] arranged  HH-1 RN  [place].   Status of service:  Completed, signed off Medicare Important Message given?  YES (If response is "NO", the following Medicare IM given date fields will be blank) Date Medicare IM given:  12/23/2013 Medicare IM given by:  Tomi Bamberger Date Additional Medicare IM given:   Additional Medicare IM given by:    Discharge Disposition:  [place]  Per UR Regulation:  Reviewed for med. necessity/level of care/duration of stay  If discussed at [place] of Stay Meetings, dates discussed:    Comments:  Contact:  [name],[name] [phone]                 [facility] Daughter [phone]                 [name],[name] [phone]  12/24/13 [name], [name] [phone] patient is for dc today, chose [place] for [facility], [place], [place]. Referral made to Sherrin Daisy notified.  [name] states she will check with the office in [place] to see if they can take patient and she will call me back. [facility] is not able to take patient, to far out for staffing, Patient [facility], referral made to [facility], [name] notified.

## 2013-12-23 NOTE — Progress Notes (Signed)
Pt had three loose stools during day shift since the flexiseal was removed around 10 AM. The patient was continent for the third stool.

## 2013-12-23 NOTE — Evaluation (Signed)
Occupational Therapy Evaluation Patient Details Name: Amilah Greenspan MRN: [id number] DOB: 11/08/1945 Today's Date: 12/23/2013    History of Present Illness Patient is a 68 y/o female admitted 7/15 with severe sepsis with suspected urinary source and hypotension. 10% superior endplate compression fracture at L1- new, old compression fx T9-12. 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 Pt currently with functional limitiations due to the deficits listed below (see OT problem list).  Pt will benefit from skilled OT to increase their independence and safety with adls and balance to allow discharge [place]. Ot to follow acutely for adl retraining and back precautions with adls.     Follow Up Recommendations  Home health OT    Equipment Recommendations  3 in 1 bedside comode    Recommendations for Other Services       Precautions / Restrictions Precautions Precautions: Fall;Back Precaution Comments: enteric precautions protective precaustions      Mobility Bed Mobility Overal bed mobility: Needs Assistance Bed Mobility: Rolling Rolling: Supervision (heavy use of bed rails)         General bed mobility comments: able to maintain back precautions for task  Transfers                 General transfer comment: just returned to bed s/p transfer to chair on arrival    Balance                                            ADL Overall ADL's : Needs assistance/impaired     Grooming: Wash/dry face;Supervision/safety;Sitting       Lower Body Bathing: Minimal assistance;Sitting/lateral leans Lower Body Bathing Details (indicate cue type and reason): pt attempting peri care on arrival and wash cloth soiled and thrown in the floor                       General ADL Comments: Ot arriving  to pt half in the bed and LB half out of the bed. Pt reports completing transfer to chair with bed pan and voiding bowels. Pt with bed pan full of stool. Wash cloth near by on the floor soiled. Pt completed bed mobility log roll supervision with rails keeping back precautions for peri care and application of barrier cream. pt states "I just want to cry. I thought I cleaned up good enough" Pt educated that all transfers should be with staff and to use call bell. Bed alarm set and tech / RN both notified of event. Pt with new bed linen applied and repositioned     Vision                     Perception     Praxis      Pertinent Vitals/Pain Abdominal pain     Hand Dominance Right   Extremity/Trunk Assessment Upper Extremity Assessment Upper Extremity Assessment: Generalized weakness   Lower Extremity Assessment Lower Extremity Assessment: Defer to PT evaluation   Cervical / Trunk Assessment Cervical / Trunk Assessment: Normal   Communication Communication Communication: No difficulties   Cognition Arousal/Alertness: Awake/alert Behavior During Therapy: WFL for tasks assessed/performed Overall Cognitive Status: Within Functional Limits for tasks assessed  General Comments       Exercises       Shoulder Instructions      Home Living Family/patient expects to be discharged to:: Private residence Living Arrangements: Children Available Help at Discharge: Family;Available 24 hours/day Type of Home: House Home Access: Stairs to enter [employer] of Steps: 1   Home Layout: One level     Bathroom Shower/Tub: Tub/shower unit Shower/tub characteristics: Door       Home Equipment: Environmental consultant - 2 wheels          Prior Functioning/Environment Level of Independence: Independent        Comments: Pt performs ADLs. Assists with some housework, daughters do the cooking and driving.    OT Diagnosis: Generalized weakness   OT  Problem List: Decreased strength;Decreased activity tolerance;Impaired balance (sitting and/or standing);Decreased safety awareness;Decreased knowledge of use of DME or AE;Decreased knowledge of precautions;Pain   OT Treatment/Interventions: Self-care/ADL training;Therapeutic exercise;DME and/or AE instruction;Energy conservation;Therapeutic activities;Patient/family education;Balance training    OT Goals(Current goals can be found in the care plan section) Acute Rehab OT Goals Patient Stated Goal: to go home ASAP OT Goal Formulation: With patient Time For Goal Achievement: 01/06/14 Potential to Achieve Goals: Fair  OT Frequency: Min 2X/week   Barriers to D/C:            Co-evaluation              End of Session Nurse Communication: Mobility status;Precautions  Activity Tolerance: Patient limited by fatigue Patient left: in bed;with call bell/phone within reach;with bed alarm set   Time: 1540-1553 OT Time Calculation (min): 13 min Charges:  OT General Charges $OT Visit: 1 Procedure OT Evaluation $Initial OT Evaluation Tier I: 1 Procedure OT Treatments $Self Care/Home Management : 8-22 mins G-Codes:    Peri Maris 01-19-14, 4:09 PM Pager: [phone]

## 2013-12-23 NOTE — Evaluation (Signed)
Physical Therapy Evaluation Patient Details Name: Olivia Nichols MRN: [id number] DOB: 1946-05-20 Today's Date: 12/23/2013   History of Present Illness  Patient is a 68 y/o female admitted 7/15 with severe sepsis with suspected urinary source and hypotension. 10% superior endplate compression fracture at L1- new, old compression fx T9-12. 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 Pt becomes SOB with activity however able to maintain Sa02 >94% during gait and exercise. Mildly unsteady during gait however balance improves with use of AD. Requires assist for bed transfers. Educated pt on back precautions and log roll technique for pain control and to protect back. Pt refusing SNF placement; reports having 24/7 supervision at home. If daughter able to provide the necessary 24/7 supervision/hands on assist for transfers and safety at home, pt safe to discharge there with follow up HHPT. Pt would benefit from skilled PT to maximize independence and improve safe mobility to allow discharge to below venue.    Follow Up Recommendations Home health PT;Supervision/Assistance - 24 hour    Equipment Recommendations  None recommended by PT    Recommendations for Other Services       Precautions / Restrictions Precautions Precautions: Fall;Back Precaution Comments: Rectal tube; enteric precautions; protective precautions. Educated on back precautions due to new compression fx. Restrictions Weight Bearing Restrictions: No      Mobility  Bed Mobility Overal bed mobility: Needs Assistance Bed Mobility: Rolling;Supine to Sit Rolling: Min assist   Supine to sit: Min assist     General bed mobility comments: VC to roll to left side, use RUE to reach to rail to assist with log roll  technique to protect back. Min A with trunk.  Transfers Overall transfer level: Needs assistance Equipment used: Rolling walker (2 wheeled) Transfers: Sit to/from [facility] Sit to Stand: Min guard Stand pivot transfers: Min guard       General transfer comment: Multiple attempts to stand using body momentum and cues for anterior translation. Stood x1 from EOB. Min guard for safety with lines/tubes.  Ambulation/Gait Ambulation/Gait assistance: Min guard Ambulation Distance (Feet): 50 Feet Assistive device: Rolling walker (2 wheeled) Gait Pattern/deviations: Step-through pattern;Decreased stride length;Trunk flexed   Gait velocity interpretation: Below normal speed for age/gender General Gait Details: Mildly unsteady, VC for upright posture and walker management.  Stairs            Wheelchair Mobility    Modified Rankin (Stroke Patients Only)       Balance Overall balance assessment: Needs assistance   Sitting balance-Leahy Scale: Fair Sitting balance - Comments: Able to sit EOB without LOB during ther ex of LEs. Use of UEs for support PRN as position of comfort due to SOB.   Standing balance support: During functional activity;Bilateral upper extremity supported Standing balance-Leahy Scale: Poor Standing balance comment: Use of RW for support.                             Pertinent Vitals/Pain Reports no pain. HR increased from 90s-120 bpm during exercise. SOB present with gait training. Pt became hypotensive post therapy session with BP 99/61, asymptomatic. RN present and aware.    Home Living Family/patient expects to be discharged to:: Private residence Living Arrangements: Children Available Help at Discharge: Family;Available  24 hours/day Type of Home: House Home Access: Stairs to enter   [employer] of Steps: 1 Home Layout: One level Home Equipment: Walker - 2 wheels      Prior Function Level of Independence:  Independent         Comments: Pt performs ADLs. Assists with some housework, daughters do the cooking and driving.     Hand Dominance   Dominant Hand: Right    Extremity/Trunk Assessment   Upper Extremity Assessment: Defer to OT evaluation           Lower Extremity Assessment: Generalized weakness         Communication   Communication: No difficulties  Cognition Arousal/Alertness: Awake/alert Behavior During Therapy: WFL for tasks assessed/performed Overall Cognitive Status: Within Functional Limits for tasks assessed                      General Comments      Exercises General Exercises - Lower Extremity Ankle Circles/Pumps: Both;10 reps;Seated Long Arc Quad: Both;10 reps;Seated Hip Flexion/Marching: Both;10 reps;Seated      Assessment/Plan    PT Assessment Patient needs continued PT services  PT Diagnosis Difficulty walking;Generalized weakness   PT Problem List Decreased strength;Cardiopulmonary status limiting activity;Decreased activity tolerance;Decreased knowledge of use of DME;Decreased balance;Decreased safety awareness;Decreased mobility;Decreased knowledge of precautions  PT Treatment Interventions DME instruction;Balance training;Gait training;Functional mobility training;Patient/family education;Therapeutic activities;Therapeutic exercise   PT Goals (Current goals can be found in the Care Plan section) Acute Rehab PT Goals Patient Stated Goal: to go home ASAP PT Goal Formulation: With patient Time For Goal Achievement: 01/06/14 Potential to Achieve Goals: Good    Frequency Min 3X/week   Barriers to discharge        Co-evaluation               End of Session Equipment Utilized During Treatment: Gait belt;Oxygen Activity Tolerance: Patient tolerated treatment well Patient left: in chair;with call bell/phone within reach Nurse Communication: Mobility status;Precautions         Time: 1694-5038 PT Time Calculation  (min): 30 min   Charges:   PT Evaluation $Initial PT Evaluation Tier I: 1 Procedure PT Treatments $Gait Training: 8-22 mins   PT G CodesCandy Sledge A 12/23/2013, 9:59 AM Candy Sledge, PT, DPT [phone]

## 2013-12-23 NOTE — Progress Notes (Addendum)
Progress Note  Olivia Nichols QPY:[id number] DOB: 1946/01/19 DOA: 12/17/2013 PCP: Fae Pippin  Admit HPI / Brief Narrative: 68 y/o F former smoker (3 PPD x35 years) w/ hx of anxiety, HLD, HTN, Squamous cell cancer of the right lower lung actively being treated with stereotactic radiotherapy, O2 dependent COPD & RA (baseline 10 mg QD pred) admitted 7/15 with severe sepsis with suspected urinary source & hypotension that responded to IVF.   HPI/Subjective: Reports that she wants to go home.  Will not go to SNF.  Still with flexiseal and foley in place.    Assessment/Plan: Squamous cell lung cancer - s/p XRT  -currently on her home regimen of 3 L O2 via [place] -notified Oncology that patient has been admitted  Severe sepsis (urosepsis) -Resolved. -Completed 5 days of antibiotics, secondary to patient's immunocompromised state  C diff Diarrhea -C. difficile positive by PCR - diarrhea continues but in decreased volume.   -will plan for 10 days of Flagyl, w/ 7/20 being first day free from other abx  -stopped protonix  -d/c rectoseal today.  Verify quantity of stooling is tolerable.  Urinary retention chronic -Patient has been having chronic urinary retention.  Foley was replaced on 7/20 due to 600 ml of urine retained in the bladder. -started on bethanechol by PCP several weeks ago.  Only marginally effective as patient has been retaining urine during this admission -Increased bethanechol to 50 mg TID -Decreased Klonopin to 0.5 mg BID as it can cause/exacerbate urinary retention -L-spine XRays without acute findings to explain retention -Patient will need urology follow up after discharge-for voiding trial  Hyponatremia -resolved w/ volume resuscitation   Hypokalemia -continue to replete and follow   HTN -added prn hydralazine  -increased lisinopril to 10 mg daily.   CAD -no c/o chest pain  -Echocardiogram; within normal limits see results below  Hyperlipidemia    -Triglycerides high at 261 - increase Zocor to 40 mg daily before adding additional agent  Acute Kidney Injury  -Resolved w/ hydration -Avoid nephrotoxic medication  Lactic Acidosis  -resolved   Hx chronic neutropenia/leukopenia  -cyclic and unexplained (per pt & [facility] records in care everywhere)   Anemia  -Continue to follow currently stable  Adrenal insufficiency  -Secondary to chronic steroid use - continue patient's home dose steroids 10 mg of prednisone daily  Mild Hyperglycemia  -Most likely iatrogenic from the chronic steroid -hemoglobin A1c= 5.6  Chronic Pain  -Secondary to rheumatoid arthritis/osteoarthritis  -Continue zanaflex PRN, reduced dose   Code Status: FULL Family Communication: spoke w/ daughters at bedside at length Disposition Plan: Hopeful for discharge to home on 7/22.  Consultants: Dr. Kara Mead (PCCM)   Procedure/Significant Events: 7/80 echocardiogram - LVEF= 60%-to 65%.  - Pulmonary arteries: PA peak pressure: 32 mm Hg (S).  Positive Culture Data: UA 7/15>> nitrite positive, small leukocytes, few bacteria, 3-6 WBC  7/17 Clostridium difficile by PCR positive  Antibiotics: Vanco 7/15 >> 7/19 Zosyn 7/15 >>7/19 Metronidazole 7/17>>7/27  DVT prophylaxis: Heparin subcutaneous  Objective: VITAL SIGNS: Blood pressure 99/66, pulse 93, temperature 97.7 F (36.5 C), temperature source Oral, resp. rate 21, height 5\' 8"  (1.727 m), weight 69.2 kg (152 lb 8.9 oz), SpO2 98.00%.  Intake/Output Summary (Last 24 hours) at 12/23/13 1259 Last data filed at 12/23/13 0549  Gross per 24 hour  Intake    450 ml  Output    950 ml  Net   -500 ml   Exam: General: Alert, orientated, elderly, weak female, comfortable in bed.  Lungs: Poor air movement in all lung fields, negative wheezing, no accessory muscle use. Cardiovascular: Regular rate and rhythm without murmur gallop or rub normal S1 and S2 Abdomen: Nontender, nondistended, soft, bowel sounds  positive, no rebound, no ascites, no appreciable mass.  rectoseal in place with approx 300 ml of liquid dark stool. Extremities: No significant cyanosis, clubbing, or edema bilateral lower extremities  Data Reviewed: Basic Metabolic Panel:  Recent Labs Lab 12/18/13 1145 12/19/13 2026 12/20/13 0240 12/21/13 0247 12/22/13 0303 12/23/13 0031  NA 131*  --  135* 136* 141 140  K 3.6*  --  3.6* 3.2* 3.3* 3.2*  CL 95*  --  98 101 105 108  CO2 18*  --  25 22 24 24   GLUCOSE 175*  --  87 86 80 85  BUN 19  --  8 5* 3* <3*  CREATININE 0.94  --  0.68 0.62 0.54 0.53  CALCIUM 7.4*  --  7.8* 7.7* 7.6* 7.7*  MG  --  1.8 1.7 1.6 1.5  --    Liver Function Tests:  Recent Labs Lab 12/17/13 1055 12/20/13 0240 12/21/13 0247 12/22/13 0303  AST 38* 16 13 18   ALT 11 12 9 8   ALKPHOS 63 54 47 47  BILITOT 0.9 0.4 0.3 0.2*  PROT 5.5* 5.3* 4.9* 4.8*  ALBUMIN 2.3* 1.8* 1.7* 1.7*   CBC:  Recent Labs Lab 12/17/13 1055 12/18/13 1145 12/20/13 0240 12/21/13 0247 12/22/13 0303 12/23/13 0031  WBC 0.6* 0.4* 1.2* 1.6* 3.4* 4.6  NEUTROABS 0.2*  --  0.5* 0.9* 2.2  --   HGB 8.8* 9.9* 9.6* 8.8* 9.6* 10.4*  HCT 26.2* 29.9* 29.6* 27.1* 29.8* 33.3*  MCV 83.7 85.7 85.1 86.6 87.1 87.6  PLT 171 184 222 239 277 291   CBG:  Recent Labs Lab 12/17/13 1608  GLUCAP 131*    Recent Results (from the past 240 hour(s))  CULTURE, BLOOD (ROUTINE X 2)     Status: None   Collection Time    12/17/13 11:30 AM      Result Value Ref Range Status   Specimen Description BLOOD BLOOD RIGHT FOREARM   Final   Special Requests BOTTLES DRAWN AEROBIC AND ANAEROBIC 5CCS   Final   Culture  Setup Time     Final   Value: 12/17/2013 16:52     Performed at [facility]   Culture     Final   Value: NO GROWTH 5 DAYS     Performed at [facility]   Report Status 12/23/2013 FINAL   Final  CULTURE, BLOOD (ROUTINE X 2)     Status: None   Collection Time    12/17/13 11:38 AM      Result Value Ref Range Status     Specimen Description BLOOD BLOOD LEFT FOREARM   Final   Special Requests BOTTLES DRAWN AEROBIC AND ANAEROBIC 5CC   Final   Culture  Setup Time     Final   Value: 12/17/2013 16:52     Performed at [facility]   Culture     Final   Value: NO GROWTH 5 DAYS     Performed at [facility]   Report Status 12/23/2013 FINAL   Final  URINE CULTURE     Status: None   Collection Time    12/17/13 12:41 PM      Result Value Ref Range Status   Specimen Description URINE, CATHETERIZED   Final   Special Requests NONE  Final   Culture  Setup Time     Final   Value: 12/17/2013 16:59     Performed at [place]     Final   Value: NO GROWTH     Performed at [facility]   Culture     Final   Value: NO GROWTH     Performed at [facility]   Report Status 12/18/2013 FINAL   Final  MRSA PCR SCREENING     Status: None   Collection Time    12/17/13  4:12 PM      Result Value Ref Range Status   MRSA by PCR NEGATIVE  NEGATIVE Final   Comment:            The GeneXpert MRSA Assay (FDA     approved for NASAL specimens     only), is one component of a     comprehensive MRSA colonization     surveillance program. It is not     intended to diagnose MRSA     infection nor to guide or     monitor treatment for     MRSA infections.  CLOSTRIDIUM DIFFICILE BY PCR     Status: Abnormal   Collection Time    12/19/13  1:34 PM      Result Value Ref Range Status   C difficile by pcr POSITIVE (*) NEGATIVE Final   Comment: CRITICAL RESULT CALLED TO, READ BACK BY AND VERIFIED WITH:     RONCALLO RN 15:20 12/19/13 ([email or web address])  CLOSTRIDIUM DIFFICILE BY PCR     Status: Abnormal   Collection Time    12/22/13 12:29 AM      Result Value Ref Range Status   C difficile by pcr POSITIVE (*) NEGATIVE Final   Comment: CRITICAL RESULT CALLED TO, READ BACK BY AND VERIFIED WITH:     RONCALLO RN 11:10 12/22/13 ([email or web address])     Studies:  Recent x-ray studies have been  reviewed in detail by the Attending Physician  Scheduled Meds:  Scheduled Meds: . amitriptyline  25 mg Oral QHS  . bethanechol  50 mg Oral TID  . budesonide-formoterol  2 puff Inhalation BID  . heparin  5,000 Units Subcutaneous 3 times per day  . lisinopril  10 mg Oral Daily  . metroNIDAZOLE  500 mg Oral 3 times per day  . potassium chloride  20 mEq Oral BID  . potassium chloride  40 mEq Oral Daily  . predniSONE  10 mg Oral Q breakfast  . simvastatin  40 mg Oral q1800    Time spent on care of this patient: 35 mins    12/23/2013, 12:59 PM   LOS: 6 days   Imogene Burn, [place] [facility]  Pager: [phone]  Attending Patient was seen, examined,treatment plan was discussed with the Physician extender. I have directly reviewed the clinical findings, lab, imaging studies and management of this patient in detail. I have made the necessary changes to the above noted documentation, and agree with the documentation, as recorded by the Physician extender.  Nena Alexander MD [facility].

## 2013-12-24 DIAGNOSIS — F17200 Nicotine dependence, unspecified, uncomplicated: Secondary | ICD-10-CM

## 2013-12-24 LAB — BASIC METABOLIC PANEL
Anion gap: 9 (ref 5–15)
CHLORIDE: 104 meq/L (ref 96–112)
CO2: 27 mEq/L (ref 19–32)
Calcium: 8.1 mg/dL — ABNORMAL LOW (ref 8.4–10.5)
Creatinine, Ser: 0.58 mg/dL (ref 0.50–1.10)
GFR calc non Af Amer: >90 mL/min (ref >90)
Glucose, Bld: 92 mg/dL (ref 70–99)
POTASSIUM: 4.1 meq/L (ref 3.7–5.3)
Sodium: 140 mEq/L (ref 137–147)

## 2013-12-24 MED ORDER — PRAVASTATIN SODIUM 80 MG PO TABS: 80.0000 mg | ORAL_TABLET | Freq: Every day | ORAL | Status: AC

## 2013-12-24 MED ORDER — METRONIDAZOLE 500 MG PO TABS: 500.0000 mg | ORAL_TABLET | Freq: Three times a day (TID) | ORAL | Status: DC

## 2013-12-24 MED ORDER — TIZANIDINE HCL 4 MG PO TABS: 2.0000 mg | ORAL_TABLET | Freq: Three times a day (TID) | ORAL | Status: AC | PRN

## 2013-12-24 MED ORDER — BETHANECHOL CHLORIDE 25 MG PO TABS: 50.0000 mg | ORAL_TABLET | Freq: Three times a day (TID) | ORAL | Status: AC

## 2013-12-24 MED ORDER — CLONAZEPAM 1 MG PO TABS: 0.5000 mg | ORAL_TABLET | Freq: Two times a day (BID) | ORAL | Status: AC | PRN

## 2013-12-24 MED ORDER — BUDESONIDE-FORMOTEROL FUMARATE 160-4.5 MCG/ACT IN AERO: 2.0000 | INHALATION_SPRAY | Freq: Two times a day (BID) | RESPIRATORY_TRACT | Status: AC

## 2013-12-24 NOTE — Discharge Summary (Signed)
Physician Discharge Summary  Olivia Nichols VVO:[id number] DOB: 02-Dec-1945 DOA: 12/17/2013  PCP: Fae Pippin  Admit date: 12/17/2013 Discharge date: 12/24/2013  Time spent: 75 minutes  Recommendations for Outpatient Follow-up:  1. Bmet / CBC in the next week.  Patient with recent C-diff and Enterococcus UTI. 2. Needs stitches on left side of neck removed. 3. Pravachol has been increased.  PPI discontinued. Klonopin and Zanaflex decreased.  Patient on Flagyl thru 7/29. 4. Home health RN, PT, OT ordered.    Discharge Diagnoses:  Active Problems:   Squamous cell lung cancer   COPD (chronic obstructive pulmonary disease)   Severe sepsis   Hypovolemic shock   Hypotension, unspecified   Discharge Condition: stable.  Diet recommendation: heart healthy  Filed Weights   12/22/13 1500 12/23/13 0543 12/24/13 0549  Weight: 70.7 kg (155 lb 13.8 oz) 69.2 kg (152 lb 8.9 oz) 67 kg (147 lb 11.3 oz)    History of present illness:  68 y/o F former smoker (3 PPD x35 years) w/ hx of anxiety, HLD, HTN, Squamous cell cancer right lower lung actively being treated with stereotactic radiotherapy, O2 dependent COPD & RA (baseline 10 mg QD pred) admitted 7/15 with severe sepsis with suspected urinary source & hypotension that responded to IVF.    Hospital Course:   Severe sepsis (urosepsis)  -Resolved.  -Completed 5 days of antibiotics for UTI, secondary to patient's immunocompromised state -Will complete 14 days of Flagyl (thru 7/29)   Squamous cell lung cancer - s/p XRT  -stable -currently on her home regimen of  O2 via [place]   C diff Diarrhea  -C. difficile positive by PCR - diarrhea has slowed significantly -14 days of Flagyl, w/ 7/20 being first day free from other abx  -stopped protonix   Urinary retention chronic  -Patient has been having chronic urinary retention. Foley was replaced on 7/20 due to 600 ml of urine retained in the bladder.  -started on bethanechol by PCP several  weeks ago. Only marginally effective as patient has been retaining urine during this admission  -Increased bethanechol to 50 mg TID  -Decreased Klonopin to 0.5 mg BID as it can cause/exacerbate urinary retention  -L-spine XRays without acute findings to explain retention  -Patient will need urology follow up after discharge-for voiding trial.  [facility] will call her with an appointment.  Hyponatremia  -resolved w/ volume resuscitation   Hypokalemia  -resolved with potassium supplementation.  Will request follow up bmet.  HTN  -resume ARB.   CAD  -no c/o chest pain  -Echocardiogram; within normal limits see results below   Hyperlipidemia  -Triglycerides high at 261 - increase Zocor to 40 mg daily inpatient.  Will d/c on Pravachol  80 mg.   Acute Kidney Injury  -Resolved w/ hydration  -Avoid nephrotoxic medication   Lactic Acidosis  -resolved   Hx chronic neutropenia/leukopenia  -cyclic and unexplained (per pt & [facility] records in care everywhere)   Anemia  -Continue to follow currently stable   Adrenal insufficiency  -Secondary to chronic steroid use - continue patient's home dose steroids 10 mg of prednisone daily   Mild Hyperglycemia  -Most likely iatrogenic from the chronic steroid  -hemoglobin A1c= 5.6   Chronic Pain  -Secondary to rheumatoid arthritis/osteoarthritis  -Continue zanaflex PRN, reduced dose    Procedures: 7/8 echocardiogram  - LVEF= 60%-to 65%.  - Pulmonary arteries: PA peak pressure: 32 mm Hg (S).   Consultations:  None.  Discharge Exam: Filed Vitals:   12/24/13  1326  BP: 118/74  Pulse: 105  Temp: 98.3 F (36.8 C)  Resp: 18   General: Alert, orientated, elderly, weak female, comfortable in bed.  Lungs: Poor air movement in all lung fields, negative wheezing, no accessory muscle use.  Cardiovascular: Regular rate and rhythm without murmur gallop or rub normal S1 and S2  Abdomen: Nontender, nondistended, soft, bowel sounds  positive, no rebound, no ascites, no appreciable mass. GU:  Foley in place. Extremities: No significant cyanosis, clubbing, 1-2+ edema     Discharge Instructions       Discharge Instructions   Care order/instruction    Complete by:  As directed   D/c with foley in place.     Diet - low sodium heart healthy    Complete by:  As directed      Increase activity slowly    Complete by:  As directed             Medication List    STOP taking these medications       omeprazole 40 MG capsule  Commonly known as:  PRILOSEC     oxyCODONE-acetaminophen 5-325 MG per tablet  Commonly known as:  PERCOCET/ROXICET      TAKE these medications       albuterol (2.5 MG/3ML) 0.083% nebulizer solution  Commonly known as:  PROVENTIL  Take 2.5 mg by nebulization every 6 (six) hours as needed for wheezing or shortness of breath.     albuterol 108 (90 BASE) MCG/ACT inhaler  Commonly known as:  PROVENTIL HFA;VENTOLIN HFA  Inhale 2 puffs into the lungs every 4 (four) hours as needed for wheezing.     amitriptyline 25 MG tablet  Commonly known as:  ELAVIL  Take 25 mg by mouth at bedtime.     aspirin 325 MG tablet  Take 325-650 mg by mouth daily. For pain     bethanechol 25 MG tablet  Commonly known as:  URECHOLINE  Take 2 tablets (50 mg total) by mouth 3 (three) times daily.     BREO ELLIPTA 100-25 MCG/INH Aepb  Generic drug:  Fluticasone Furoate-Vilanterol  Inhale 1 puff into the lungs daily.     budesonide-formoterol 160-4.5 MCG/ACT inhaler  Commonly known as:  SYMBICORT  Inhale 2 puffs into the lungs 2 (two) times daily.     clonazePAM 1 MG tablet  Commonly known as:  KLONOPIN  Take 0.5 tablets (0.5 mg total) by mouth 2 (two) times daily as needed for anxiety.     fenofibrate 160 MG tablet  Take 160 mg by mouth daily.     losartan 100 MG tablet  Commonly known as:  COZAAR  Take 100 mg by mouth daily.     metroNIDAZOLE 500 MG tablet  Commonly known as:  FLAGYL  Take 1  tablet (500 mg total) by mouth every 8 (eight) hours.     pravastatin 80 MG tablet  Commonly known as:  PRAVACHOL  Take 1 tablet (80 mg total) by mouth at bedtime.     predniSONE 5 MG tablet  Commonly known as:  DELTASONE  Take 10 mg by mouth daily with breakfast.     tiZANidine 4 MG tablet  Commonly known as:  ZANAFLEX  Take 0.5 tablets (2 mg total) by mouth every 8 (eight) hours as needed for muscle spasms.       Allergies  Allergen Reactions  . Codeine Other (See Comments)    Passes out  . Morphine And Related Other (See Comments)  Patient assumes she is allergic to morphine because she is allergic to codeine  . Tramadol     Passes out  . Trazodone And Nefazodone Nausea And Vomiting   Follow-up Information   Please follow up. ([facility] will call you with an appointment to take the foley out.)       Follow up with CONROY,NATHAN, PA-C In 1 week. (check potassium and hospital follow up.)    Specialty:  Physician Assistant   Contact information:   [address] [place] [address] [phone]        The results of significant diagnostics from this hospitalization (including imaging, microbiology, ancillary and laboratory) are listed below for reference.    Significant Diagnostic Studies: Dg Lumbar Spine 2-3 Views  12/21/2013   CLINICAL DATA:  Back injury.  Urinary retention.  EXAM: LUMBAR SPINE - 2-3 VIEW  COMPARISON:  11/20/2013 chest CT  FINDINGS: Aortoiliac atherosclerotic vascular disease. There is 9 degrees of dextroconvex lumbar scoliosis measured between T11 and L4. Bony demineralization if that diffuse bony demineralization.  Facet arthropathy noted at L4-5 at L5-S1 with 6 mm anterolisthesis at L4-5 and 4 mm retrolisthesis at L3-4. Remote compression fractures at T12, T11, T9, and T10, similar to prior. Blunted bilateral costophrenic angle suggesting pleural effusions with faint opacities in both lung bases on the lateral  projection.  Subtle 10% superior endplate compression fracture of L1, new compared to the prior exam.  IMPRESSION: 1. 10% superior endplate compression fracture at L1, new compared to the prior exam. No definite posterior bony retropulsion. 2. Subluxations at L3-4 and L4-5 are probably degenerative given the degree of associated facet arthropathy, and are less likely to be acute. 3. Osteoporosis. 4. Old compression fractures at T9, T10, T11, and T12. 5. Atherosclerosis. 6. Dextroconvex lumbar scoliosis.   Electronically Signed   By: Sherryl Barters M.D.   On: 12/21/2013 21:02   US Renal  12/19/2013   CLINICAL DATA:  Renal failure  EXAM: RENAL/URINARY TRACT ULTRASOUND COMPLETE  COMPARISON:  None.  FINDINGS: Right Kidney:  Length: 11.5 cm. Echogenicity within normal limits. No mass or hydronephrosis visualized.  Left Kidney:  Length: 11 cm. Echogenicity within normal limits. No mass or hydronephrosis visualized.  Bladder:  A Foley catheter is in place and the bladder is decompressed.  IMPRESSION: No hydronephrosis or focal renal parenchymal abnormality.   Electronically Signed   By: Conchita Paris M.D.   On: 12/19/2013 11:00   Dg Chest Port 1 View  12/17/2013   CLINICAL DATA:  Hypertension. Weakness. History of lung cancer with radiation therapy.  EXAM: PORTABLE CHEST - 1 VIEW  COMPARISON:  CT 11/20/2013.  Radiographs 03/22/2013.  FINDINGS: 1123 hr. There are lower lung volumes with progressive coarse interstitial opacities throughout both lungs. There is probable superimposed bibasilar atelectasis. Patient is rotated to the right. Given this and the lower lung volumes, the heart size and mediastinal contours are grossly stable without progressive adenopathy. There is no pleural effusion or pneumothorax.  IMPRESSION: Much lower lung volumes with probable atelectasis superimposed on chronic lung disease. Pulmonary edema and atypical inflammation cannot be excluded.   Electronically Signed   By: Camie Patience  M.D.   On: 12/17/2013 11:38    Microbiology:  Positive Culture Data:  UA 7/15>> nitrite positive, small leukocytes, few bacteria, 3-6 WBC  7/17 Clostridium difficile by PCR positive   Recent Results (from the past 240 hour(s))  CULTURE, BLOOD (ROUTINE X 2)     Status:  None   Collection Time    12/17/13 11:30 AM      Result Value Ref Range Status   Specimen Description BLOOD BLOOD RIGHT FOREARM   Final   Special Requests BOTTLES DRAWN AEROBIC AND ANAEROBIC 5CCS   Final   Culture  Setup Time     Final   Value: 12/17/2013 16:52     Performed at [facility]   Culture     Final   Value: NO GROWTH 5 DAYS     Performed at [facility]   Report Status 12/23/2013 FINAL   Final  CULTURE, BLOOD (ROUTINE X 2)     Status: None   Collection Time    12/17/13 11:38 AM      Result Value Ref Range Status   Specimen Description BLOOD BLOOD LEFT FOREARM   Final   Special Requests BOTTLES DRAWN AEROBIC AND ANAEROBIC 5CC   Final   Culture  Setup Time     Final   Value: 12/17/2013 16:52     Performed at [facility]   Culture     Final   Value: NO GROWTH 5 DAYS     Performed at [facility]   Report Status 12/23/2013 FINAL   Final  URINE CULTURE     Status: None   Collection Time    12/17/13 12:41 PM      Result Value Ref Range Status   Specimen Description URINE, CATHETERIZED   Final   Special Requests NONE   Final   Culture  Setup Time     Final   Value: 12/17/2013 16:59     Performed at [place]     Final   Value: NO GROWTH     Performed at [facility]   Culture     Final   Value: NO GROWTH     Performed at [facility]   Report Status 12/18/2013 FINAL   Final  MRSA PCR SCREENING     Status: None   Collection Time    12/17/13  4:12 PM      Result Value Ref Range Status   MRSA by PCR NEGATIVE  NEGATIVE Final   Comment:            The GeneXpert MRSA Assay (FDA     approved for NASAL specimens     only),  is one component of a     comprehensive MRSA colonization     surveillance program. It is not     intended to diagnose MRSA     infection nor to guide or     monitor treatment for     MRSA infections.  CLOSTRIDIUM DIFFICILE BY PCR     Status: Abnormal   Collection Time    12/19/13  1:34 PM      Result Value Ref Range Status   C difficile by pcr POSITIVE (*) NEGATIVE Final   Comment: CRITICAL RESULT CALLED TO, READ BACK BY AND VERIFIED WITH:     RONCALLO RN 15:20 12/19/13 ([email or web address])  CLOSTRIDIUM DIFFICILE BY PCR     Status: Abnormal   Collection Time    12/22/13 12:29 AM      Result Value Ref Range Status   C difficile by pcr POSITIVE (*) NEGATIVE Final   Comment: CRITICAL RESULT CALLED TO, READ BACK BY AND VERIFIED WITH:     RONCALLO RN 11:10 12/22/13 ([email or web address])     Labs: Basic Metabolic  Panel:  Recent Labs Lab 12/18/13 1145 12/19/13 2026 12/20/13 0240 12/21/13 0247 12/22/13 0303 12/23/13 0031 12/24/13 0704  NA 131*  --  135* 136* 141 140 140  K 3.6*  --  3.6* 3.2* 3.3* 3.2* 4.1  CL 95*  --  98 101 105 108 104  CO2 18*  --  25 22 24 24 27   GLUCOSE 175*  --  87 86 80 85 92  BUN 19  --  8 5* 3* <3* <3*  CREATININE 0.94  --  0.68 0.62 0.54 0.53 0.58  CALCIUM 7.4*  --  7.8* 7.7* 7.6* 7.7* 8.1*  MG  --  1.8 1.7 1.6 1.5  --   --    Liver Function Tests:  Recent Labs Lab 12/20/13 0240 12/21/13 0247 12/22/13 0303  AST 16 13 18   ALT 12 9 8   ALKPHOS 54 47 47  BILITOT 0.4 0.3 0.2*  PROT 5.3* 4.9* 4.8*  ALBUMIN 1.8* 1.7* 1.7*   CBC:  Recent Labs Lab 12/18/13 1145 12/20/13 0240 12/21/13 0247 12/22/13 0303 12/23/13 0031  WBC 0.4* 1.2* 1.6* 3.4* 4.6  NEUTROABS  --  0.5* 0.9* 2.2  --   HGB 9.9* 9.6* 8.8* 9.6* 10.4*  HCT 29.9* 29.6* 27.1* 29.8* 33.3*  MCV 85.7 85.1 86.6 87.1 87.6  PLT 184 222 239 277 291   BNP: BNP (last 3 results)  Recent Labs  12/17/13 1055  PROBNP 10581.0*   CBG:  Recent Labs Lab 12/17/13 1608  GLUCAP 131*        SignedKaren Kitchens [phone]  [facility] 12/24/2013, 4:00 PM

## 2013-12-24 NOTE — Progress Notes (Signed)
PT Cancellation Note  Patient Details Name: Nayelli Inglis MRN: [id number] DOB: Jun 01, 1946   Cancelled Treatment:    Reason Eval/Treat Not Completed: Patient declined, no reason specified Pt declines participating in therapy this PM; "I am just not up to it hunny, I have been up all day." Pt reports wanting to save energy to going home later this PM. Will follow up on 7/23 if pt not discharged.    Candy Sledge A 12/24/2013, 2:57 PM Candy Sledge, [place], DPT [phone]

## 2013-12-24 NOTE — Progress Notes (Signed)
Occupational Therapy Treatment Patient Details Name: Olivia Nichols MRN: [id number] DOB: 1945/09/19 Today's Date: 12/24/2013    History of present illness Patient is a 68 y/o female admitted 7/15 with severe sepsis with suspected urinary source and hypotension. 10% superior endplate compression fracture at L1- new, old compression fx T9-12. PMH positive for SCCA of right lower lung being treated with stereotactic radiotherapy, 02 dependent, RA, COPD, skin ca s/p removal 7/14, HTN, CAD   OT comments  Pt participating in ADLs during session. Education provided. Recommending HHOT for d/c.   Follow Up Recommendations  Home health OT    Equipment Recommendations  3 in 1 bedside comode    Recommendations for Other Services      Precautions / Restrictions Precautions Precautions: Fall;Back Precaution Comments: enteric precautions protective precaustions Restrictions Weight Bearing Restrictions: No       Mobility Bed Mobility Overal bed mobility: Needs Assistance Bed Mobility: Rolling;Sidelying to Sit Rolling: Min guard Sidelying to sit: Mod assist       General bed mobility comments: cues for log roll technique.  Transfers Overall transfer level: Needs assistance Equipment used: Rolling walker (2 wheeled) Transfers: Sit to/from [facility] Sit to Stand: Min guard Stand pivot transfers: Min guard       General transfer comment: cues for technique.    Balance                                   ADL Overall ADL's : Needs assistance/impaired     Grooming: Wash/dry hands;Min guard;Standing               Lower Body Dressing: Minimal assistance;Sitting/lateral leans;With adaptive equipment (socks)   Toilet Transfer: Min guard;Stand-pivot;BSC;RW   Toileting- Clothing Manipulation and Hygiene: Minimal assistance;Sit to/from stand       Functional mobility during ADLs: Min guard;Rolling walker General ADL Comments: Educated on energy  conservation techniques. Educated on safety tips for home (safe shoewear, rugs, bag on walker, sitting for most of LB ADLs). Pt incontinent of stool and OT assisted pt to clean more thoroughly for hygiene. Educated on AE for LB ADLs and pt wanted to practice with sockaid.  Recommended pt practice tub transfer with [facility] therapist.  Explained performing LB ADLs will probably be easier sitting in chair.       Vision                     Perception     Praxis      Cognition   Behavior During Therapy: [facility] for tasks assessed/performed Overall Cognitive Status: Within Functional Limits for tasks assessed                       Extremity/Trunk Assessment               Exercises     Shoulder Instructions       General Comments      Pertinent Vitals/ Pain       C/o throughout session. Repositioned at end of session.  Home Living                                          Prior Functioning/Environment              Frequency Min 2X/week  Progress Toward Goals  OT Goals(current goals can now be found in the care plan section)  Progress towards OT goals: Progressing toward goals  Acute Rehab OT Goals Patient Stated Goal: not stated OT Goal Formulation: With patient Time For Goal Achievement: 01/06/14 Potential to Achieve Goals: Fair ADL Goals Pt Will Perform Grooming: with modified independence;standing Pt Will Perform Upper Body Bathing: with set-up;sitting Pt Will Perform Lower Body Bathing: with set-up;sit to/from stand Pt Will Perform Lower Body Dressing: with set-up;with supervision;sit to/from stand Pt Will Transfer to Toilet: with supervision;bedside commode Pt Will Perform Tub/Shower Transfer: with min guard assist;ambulating;Tub transfer  Plan Discharge plan remains appropriate    Co-evaluation                 End of Session Equipment Utilized During Treatment: Gait belt;Rolling walker   Activity Tolerance  Patient limited by fatigue   Patient Left in chair;with call bell/phone within reach   Nurse Communication          Time: 1140-1210 OT Time Calculation (min): 30 min  Charges: OT General Charges $OT Visit: 1 Procedure OT Treatments $Self Care/Home Management : 23-37 mins  Benito Mccreedy OTR/L [phone] 12/24/2013, 1:41 PM

## 2013-12-24 NOTE — Discharge Instructions (Signed)
·   You have had C-diff diarrhea.  Please complete the entire course of Flagyl thru 7/29.    Pravachol has been increased to 80 mg daily  Prilosec (PPI) has been discontinued as it can contribute to C-diff  Klonopin has been decreased to 0.5 tab twice a day as it can cause urinary retention.  Zanaflex has been reduced to 2 mg as it can cause confusion.    Please follow up with [facility] to have your foley catheter removed at their first available appointment.

## 2014-01-07 ENCOUNTER — Telehealth: Payer: Self-pay | Admitting: Dietician

## 2014-01-07 NOTE — Telephone Encounter (Signed)
Brief Outpatient Oncology Nutrition Note  Patient has been identified to be at risk on malnutrition screen.  Wt Readings from Last 10 Encounters:  12/24/13 147 lb 11.3 oz (67 kg)  12/10/13 128 lb 9.6 oz (58.333 kg)  11/13/13 137 lb 9.6 oz (62.415 kg)  07/17/13 133 lb 3.2 oz (60.419 kg)  06/21/13 136 lb 11 oz (62 kg)  05/23/13 134 lb (60.782 kg)  04/30/13 130 lb 11.2 oz (59.285 kg)  04/08/13 128 lb (58.06 kg)  04/02/13 128 lb (58.06 kg)  03/12/13 128 lb 12.8 oz (58.423 kg)     Dx:  Squamous cell lung cancer  Called patient due decreased intake per chart.  Patient with some difficulty swallowing.  Food gets stuck in her throat.  Spoke with daughter who reports that patient is now eating much better since last hospitalization.  Drinks Boost.  Will mail tips to increase calories and protein along with Boost Coupons and contact information for the [facility].  Antonieta Iba, RD, LDN

## 2014-01-18 NOTE — Discharge Summary (Signed)
Pt seen and examined. Agree with above note by Ms. York, PA-C. Briefly, pt presented with sepsis with UTI in the setting of concurrent tx with radiation tx. Pt also found to be pos for cdiff colitis. For discharge with close follow up.

## 2014-01-26 ENCOUNTER — Telehealth: Payer: Self-pay | Admitting: Oncology

## 2014-01-26 NOTE — Telephone Encounter (Signed)
Anderson Malta called and was wondering if Girtie should keep the September 16 appointment with Dr. Sondra Come.  Lorena recently was discharged from the hospital with sepsis from a UTI.  Anderson Malta said the swelling in her legs and the wounds have improved and a lot of her medications have been changes.  Advised her to keep the appointment for 9/16.  Anderson Malta verbalized agreement.

## 2014-02-13 NOTE — Progress Notes (Signed)
Thoracic Location of Tumor / Histology: squamous cell lung cancer, right upper lobe pulmonary nodule, left lung base nodule   Patient had a CT scan of her chest on 11/20/13 which showed "Interval increase in size of right upper lobe pulmonary nodule concerning for progression of metastatic bronchogenic carcinoma or a  secondary primary bronchogenic carcinoma. Extensive interstitial lung disease with frank honeycombing at the lung bases. Small 7 mm nodule at the left lung base, not seen on prior, recommend attention on follow-up. "   Tobacco/Marijuana/Snuff/ETOH use: quit smoking 03/2013, smoked 1.5 packs per day for 45 years, no ETOH use   Past/Anticipated interventions by cardiothoracic surgery, if any: no   Past/Anticipated interventions by medical oncology, if any: no   Signs/Symptoms  Weight changes, if any: is down to 119.2 lb from 128.9 lb on 12/10/13.  She is taking lasix now for fluid. Respiratory complaints, if any: occasional cough, has shortness of breath with activity, oxygen saturation is 100% on room air, she uses 2 l of oxygen prn at home  Hemoptysis, if any: no  Pain issues, if any: no  SAFETY ISSUES:  Prior radiation? Yes - SBRT to right lower lung 12/15  Pacemaker/ICD? no  Possible current pregnancy?no  Is the patient on methotrexate? no  Current Complaints / other details: Patient was recently hospitalized on 12/17/13 with sepsis and C Diff.  She is using a walker today and is with her two daughters.

## 2014-02-18 ENCOUNTER — Encounter: Payer: Self-pay | Admitting: Radiation Oncology

## 2014-02-18 ENCOUNTER — Ambulatory Visit
Admission: RE | Admit: 2014-02-18 | Discharge: 2014-02-18 | Disposition: A | Discharge status: Home / Self Care | Payer: Medicare Other | Source: Ambulatory Visit | Attending: Radiation Oncology | Admitting: Radiation Oncology

## 2014-02-18 VITALS — BP 133/71 | HR 94 | Temp 97.5°F | Resp 20 | Ht 68.0 in | Wt 119.2 lb

## 2014-02-18 DIAGNOSIS — M7989 Other specified soft tissue disorders: Secondary | ICD-10-CM | POA: Diagnosis not present

## 2014-02-18 DIAGNOSIS — M545 Low back pain, unspecified: Secondary | ICD-10-CM | POA: Diagnosis not present

## 2014-02-18 DIAGNOSIS — C3491 Malignant neoplasm of unspecified part of right bronchus or lung: Secondary | ICD-10-CM

## 2014-02-18 DIAGNOSIS — Z79899 Other long term (current) drug therapy: Secondary | ICD-10-CM | POA: Diagnosis not present

## 2014-02-18 DIAGNOSIS — S91309A Unspecified open wound, unspecified foot, initial encounter: Secondary | ICD-10-CM | POA: Diagnosis not present

## 2014-02-18 DIAGNOSIS — L539 Erythematous condition, unspecified: Secondary | ICD-10-CM | POA: Diagnosis not present

## 2014-02-18 DIAGNOSIS — L989 Disorder of the skin and subcutaneous tissue, unspecified: Secondary | ICD-10-CM | POA: Diagnosis not present

## 2014-02-18 DIAGNOSIS — X58XXXA Exposure to other specified factors, initial encounter: Secondary | ICD-10-CM | POA: Diagnosis not present

## 2014-02-18 DIAGNOSIS — J984 Other disorders of lung: Secondary | ICD-10-CM | POA: Diagnosis not present

## 2014-02-18 DIAGNOSIS — Z7982 Long term (current) use of aspirin: Secondary | ICD-10-CM | POA: Diagnosis not present

## 2014-02-18 DIAGNOSIS — C3430 Malignant neoplasm of lower lobe, unspecified bronchus or lung: Secondary | ICD-10-CM | POA: Diagnosis not present

## 2014-02-18 DIAGNOSIS — Z510 Encounter for antineoplastic radiation therapy: Secondary | ICD-10-CM | POA: Diagnosis not present

## 2014-02-18 NOTE — Progress Notes (Signed)
Radiation Oncology         [phone] ________________________________  Name: Olivia Nichols MRN: [id number]  Date: 02/18/2014  DOB: [birth year]  Follow-Up Visit Note  CC: Dorise Hiss, MD  Diagnosis:   Clinical stage I non-small cell lung cancer presenting in the right lower lung field   Interval Since Last Radiation:  9  months  Narrative:    The patient returns today for further evaluation. She was seen 2 months after  her 6 month followup after her SBRT directed at the right lower lung lesion. A repeat chest CT scan showed shrinkage of this lesion however a lesion in the right upper lung area had progressed. The patient continues to be bothered by low back pain. She recently injured her left foot has developed a open lesion in this area. She also has had problems with significant edema in her lower extremities.  The patient was admitted for several days in July secondary to severe urosepsis, hypovolemic shock and hypotension. She continues to be fatigued since this issue. She denies any pain within the chest area significant cough or hemoptysis. She continues to use supplemental oxygen currently at night to sleep. She was seen by medical oncology recently recommended further evaluation of the enlarging right upper lobe lesion. Patient presents today to be considered for additional radiation therapy.                      ALLERGIES:  is allergic to codeine; morphine and related; tramadol; and trazodone and nefazodone.  Meds: Current Outpatient Prescriptions  Medication Sig Dispense Refill  . albuterol (PROVENTIL HFA;VENTOLIN HFA) 108 (90 BASE) MCG/ACT inhaler Inhale 2 puffs into the lungs every 4 (four) hours as needed for wheezing.      [name] albuterol (PROVENTIL) (2.5 MG/3ML) 0.083% nebulizer solution Take 2.5 mg by nebulization every 6 (six) hours as needed for wheezing or shortness of breath.      [name] amitriptyline (ELAVIL) 25 MG tablet Take 25 mg by mouth at  bedtime.      [name] aspirin 325 MG tablet Take 325-650 mg by mouth daily. For pain      . bethanechol (URECHOLINE) 25 MG tablet Take 2 tablets (50 mg total) by mouth 3 (three) times daily.  180 tablet  1  . budesonide-formoterol (SYMBICORT) 160-4.5 MCG/ACT inhaler Inhale 2 puffs into the lungs 2 (two) times daily.  1 Inhaler  12  . clonazePAM (KLONOPIN) 1 MG tablet Take 0.5 tablets (0.5 mg total) by mouth 2 (two) times daily as needed for anxiety.      . fenofibrate 160 MG tablet Take 160 mg by mouth daily.      . Fluticasone Furoate-Vilanterol (BREO ELLIPTA) 100-25 MCG/INH AEPB Inhale 1 puff into the lungs daily.      . furosemide (LASIX) 20 MG tablet Take 20 mg by mouth daily.      [name] losartan (COZAAR) 100 MG tablet Take 100 mg by mouth daily.      . metroNIDAZOLE (FLAGYL) 500 MG tablet Take 1 tablet (500 mg total) by mouth every 8 (eight) hours.  21 tablet  0  . pravastatin (PRAVACHOL) 80 MG tablet Take 1 tablet (80 mg total) by mouth at bedtime.  30 tablet  3  . predniSONE (DELTASONE) 5 MG tablet Take 10 mg by mouth daily with breakfast.      . tiZANidine (ZANAFLEX) 4 MG tablet Take 0.5 tablets (2 mg total) by mouth every 8 (eight) hours  as needed for muscle spasms.      . [DISCONTINUED] lisinopril (PRINIVIL,ZESTRIL) 20 MG tablet Take 20 mg by mouth daily.       No current facility-administered medications for this encounter.    Physical Findings: The patient is in no acute distress. Patient is alert and oriented. She is accompanied by 2 family members on evaluation today   height is 5\' 8"  (1.727 m) and weight is 119 lb 3.2 oz (54.069 kg). Her oral temperature is 97.5 F (36.4 C). Her blood pressure is 133/71 and her pulse is 94. Her respiration is 20 and oxygen saturation is 100%. .  No palpable supraclavicular or axillary adenopathy. The lungs are clear to auscultation. The heart has a regular rhythm and rate  Lab Findings: Lab Results  Component Value Date   WBC 4.6 12/23/2013   HGB 10.4*  12/23/2013   HCT 33.3* 12/23/2013   MCV 87.6 12/23/2013   PLT 291 12/23/2013      Radiographic Findings: No results found.  Impression: Clinical stage I non-small cell lung cancer presenting in the right lower lung field.  The patient's lesion in the right lower lobe has responded well to SBRT. She has an enlarging lesion in the right upper lobe which is likely a second primary. The patient would be a candidate for radiation therapy to this area, however due to the location would not be a candidate for SBRT. Unless the lesion has enlarged significantly she would be a candidate for ~ 2 weeks of focused high-dose radiation therapy. I discussed the treatment course side effects and potential toxicities of radiation therapy in this situation particularly as it relates to the patient's COPD and prior radiation therapy. Patient appears to understand and wishes to proceed with planned course of treatment   Plan:  Simulation and planning September 22 with treatments to be in approximately 10 days later. I anticipate approximately 2 weeks of radiation therapy  ____________________________________ Blair Promise, MD

## 2014-02-18 NOTE — Progress Notes (Signed)
Please see the Nurse Progress Note in the MD Initial Consult Encounter for this patient. 

## 2014-02-24 ENCOUNTER — Ambulatory Visit
Admission: RE | Admit: 2014-02-24 | Discharge: 2014-02-24 | Disposition: A | Discharge status: Home / Self Care | Payer: Medicare Other | Source: Ambulatory Visit | Attending: Radiation Oncology | Admitting: Radiation Oncology

## 2014-02-24 DIAGNOSIS — C3491 Malignant neoplasm of unspecified part of right bronchus or lung: Secondary | ICD-10-CM

## 2014-02-24 DIAGNOSIS — Z510 Encounter for antineoplastic radiation therapy: Secondary | ICD-10-CM | POA: Diagnosis not present

## 2014-02-24 NOTE — Progress Notes (Signed)
  Radiation Oncology         [phone] ________________________________  Name: Olivia Nichols MRN: [id number]  Date: 02/24/2014  DOB: December 01, 1945  SIMULATION AND TREATMENT PLANNING NOTE  DIAGNOSIS:  Clinical stage I  lung cancer presenting in the right upper lung field  NARRATIVE:  The patient was brought to the [place] suite.  Identity was confirmed.  All relevant records and images related to the planned course of therapy were reviewed.  The patient freely provided informed written consent to proceed with treatment after reviewing the details related to the planned course of therapy. The consent form was witnessed and verified by the simulation staff.  Then, the patient was set-up in a stable reproducible  supine position for radiation therapy.  CT images were obtained.  Surface markings were placed.  The CT images were loaded into the planning software.  Then the target and avoidance structures were contoured.  Treatment planning then occurred.  The radiation prescription was entered and confirmed.  Then, I designed and supervised the construction of a total of 2 medically necessary complex treatment devices.  I have requested : Intensity Modulated Radiotherapy (IMRT) is medically necessary for this case for the following reason:  Close proximity to the spinal cord and previous treatment in the chest region.  I have ordered:dose calc.  PLAN:  The patient will receive 50 Gy in 10 fractions using stereotactic radiation therapy techniques with intensity modulated radiation therapy (VMAT).  ________________________________  -----------------------------------  Blair Promise, PhD, MD

## 2014-02-26 DIAGNOSIS — Z510 Encounter for antineoplastic radiation therapy: Secondary | ICD-10-CM | POA: Diagnosis not present

## 2014-03-02 DIAGNOSIS — Z510 Encounter for antineoplastic radiation therapy: Secondary | ICD-10-CM | POA: Diagnosis not present

## 2014-03-02 NOTE — Addendum Note (Signed)
Encounter addended by: Jacqulyn Liner, RN on: 03/02/2014 11:12 AM<BR>     Documentation filed: Charges VN

## 2014-03-05 ENCOUNTER — Ambulatory Visit
Admission: RE | Admit: 2014-03-05 | Discharge: 2014-03-05 | Disposition: A | Discharge status: Home / Self Care | Payer: Medicare Other | Source: Ambulatory Visit | Attending: Radiation Oncology | Admitting: Radiation Oncology

## 2014-03-05 ENCOUNTER — Encounter: Payer: Self-pay | Admitting: Radiation Oncology

## 2014-03-05 VITALS — BP 138/61 | HR 90 | Temp 98.1°F | Resp 20

## 2014-03-05 DIAGNOSIS — Z510 Encounter for antineoplastic radiation therapy: Secondary | ICD-10-CM | POA: Diagnosis present

## 2014-03-05 DIAGNOSIS — C3411 Malignant neoplasm of upper lobe, right bronchus or lung: Secondary | ICD-10-CM | POA: Insufficient documentation

## 2014-03-05 DIAGNOSIS — R5383 Other fatigue: Secondary | ICD-10-CM | POA: Insufficient documentation

## 2014-03-05 DIAGNOSIS — R05 Cough: Secondary | ICD-10-CM | POA: Diagnosis not present

## 2014-03-05 DIAGNOSIS — C3491 Malignant neoplasm of unspecified part of right bronchus or lung: Secondary | ICD-10-CM

## 2014-03-05 NOTE — Progress Notes (Addendum)
Took patient's vitals.  She denies pain.  Will see MD after treatment.

## 2014-03-05 NOTE — Progress Notes (Signed)
  Radiation Oncology         [phone] ________________________________  Name: Khushbu Pippen MRN: [id number]  Date: 03/05/2014  DOB: 1946/04/24  Weekly Radiation Therapy Management  Current Dose: 5 Gy     Planned Dose:  50 Gy  Narrative . . . . . . . . The patient presents for routine under treatment assessment.                                   The patient is without complaint. The patient did slip in the changing room experiencing some abrasions. She denies any loss of consciousness to                                 Set-up films were reviewed.                                 The chart was checked. Physical Findings. . .  oral temperature is 98.1 F (36.7 C). Her blood pressure is 138/61 and her pulse is 90. Her respiration is 20 and oxygen saturation is 100%. . Weight essentially stable.  The patient is able to stand without difficulty.. No tenderness in the pelvic region. Patient does have some abrasions along the hand and forearm area which were cleansed and bandaged. No difficulties with range of movement in the patient's hand or arm area. Impression . . . . . . . The patient is tolerating radiation. Plan . . . . . . . . . . . . Continue treatment as planned.  ________________________________   Blair Promise, PhD, MD

## 2014-03-05 NOTE — Progress Notes (Signed)
Cleaned patient's skin tears on her left forearm and right hand with a sterile q-tips and normal saline.  Applied triple antibiotic ointment to tears and then covered with Telfa pads.  Secured with paper tape.  Escorted patient in a wheelchair to meet her family in the waiting room.

## 2014-03-05 NOTE — Addendum Note (Signed)
Encounter addended by: Jacqulyn Liner, RN on: 03/05/2014  5:05 PM<BR>     Documentation filed: Demographics Visit

## 2014-03-06 ENCOUNTER — Ambulatory Visit
Admission: RE | Admit: 2014-03-06 | Discharge: 2014-03-06 | Disposition: A | Discharge status: Home / Self Care | Payer: Medicare Other | Source: Ambulatory Visit | Attending: Radiation Oncology | Admitting: Radiation Oncology

## 2014-03-06 ENCOUNTER — Telehealth: Payer: Self-pay | Admitting: Oncology

## 2014-03-06 DIAGNOSIS — Z510 Encounter for antineoplastic radiation therapy: Secondary | ICD-10-CM | POA: Diagnosis not present

## 2014-03-06 DIAGNOSIS — C3491 Malignant neoplasm of unspecified part of right bronchus or lung: Secondary | ICD-10-CM

## 2014-03-06 NOTE — Telephone Encounter (Signed)
Called Olivia Nichols and left a message for a return call.  Also tried calling her cell number and it has been disconnected.

## 2014-03-09 ENCOUNTER — Encounter: Payer: Self-pay | Admitting: *Deleted

## 2014-03-09 ENCOUNTER — Ambulatory Visit
Admission: RE | Admit: 2014-03-09 | Discharge: 2014-03-09 | Disposition: A | Discharge status: Home / Self Care | Payer: Medicare Other | Source: Ambulatory Visit | Attending: Radiation Oncology | Admitting: Radiation Oncology

## 2014-03-09 DIAGNOSIS — Z510 Encounter for antineoplastic radiation therapy: Secondary | ICD-10-CM | POA: Diagnosis not present

## 2014-03-09 DIAGNOSIS — C3491 Malignant neoplasm of unspecified part of right bronchus or lung: Secondary | ICD-10-CM

## 2014-03-10 ENCOUNTER — Ambulatory Visit
Admission: RE | Admit: 2014-03-10 | Discharge: 2014-03-10 | Disposition: A | Discharge status: Home / Self Care | Payer: Medicare Other | Source: Ambulatory Visit | Attending: Radiation Oncology | Admitting: Radiation Oncology

## 2014-03-10 ENCOUNTER — Encounter: Payer: Self-pay | Admitting: Radiation Oncology

## 2014-03-10 VITALS — BP 120/64 | HR 92 | Temp 98.2°F | Resp 20 | Ht 68.0 in | Wt 118.9 lb

## 2014-03-10 DIAGNOSIS — C3491 Malignant neoplasm of unspecified part of right bronchus or lung: Secondary | ICD-10-CM

## 2014-03-10 DIAGNOSIS — Z510 Encounter for antineoplastic radiation therapy: Secondary | ICD-10-CM | POA: Diagnosis not present

## 2014-03-10 NOTE — Progress Notes (Signed)
  Radiation Oncology         [phone] ________________________________  Name: Olivia Nichols MRN: [id number]  Date: 03/10/2014  DOB: [birth year]  Weekly Radiation Therapy Management  Current Dose: 20 Gy     Planned Dose:  50 Gy  Narrative . . . . . . . . The patient presents for routine under treatment assessment.                                   The patient is without complaint except for some fatigue.                                 Set-up films were reviewed.                                 The chart was checked. Physical Findings. . .  height is 5\' 8"  (1.727 m) and weight is 118 lb 14.4 oz (53.933 kg). Her oral temperature is 98.2 F (36.8 C). Her blood pressure is 120/64 and her pulse is 92. Her respiration is 20 and oxygen saturation is 98%. . Weight essentially stable.  No significant changes. The abrasions along the arm and hand area are healing well without signs of infection. Impression . . . . . . . The patient is tolerating radiation. Plan . . . . . . . . . . . . Continue treatment as planned.  ________________________________   Blair Promise, PhD, MD

## 2014-03-10 NOTE — Progress Notes (Addendum)
Olivia Nichols has completed 4 fractions to her right lower lung.  She denies pain and shortness of breath.  She reports an occasional cough with green sputum.  She denies hemoptysis.  She reports fatigue.  She denies trouble swallowing and a sore throat.  Her skin is intact on her right chest and back.  The skin tears on her right hand are scabbed.  The tear on her left upper arm is healing.  Reviewed side effects of radiation with patient including fatigue, skin changes and throat changes.  She reports she has the Radiation Therapy and You book from her previous treatments.  Advised her to contact nursing with any questions or concerns.

## 2014-03-10 NOTE — Addendum Note (Signed)
Encounter addended by: Jacqulyn Liner, RN on: 03/10/2014  1:41 PM<BR>     Documentation filed: Notes Section

## 2014-03-11 ENCOUNTER — Ambulatory Visit
Admission: RE | Admit: 2014-03-11 | Discharge: 2014-03-11 | Disposition: A | Discharge status: Home / Self Care | Payer: Medicare Other | Source: Ambulatory Visit | Attending: Radiation Oncology | Admitting: Radiation Oncology

## 2014-03-11 DIAGNOSIS — C3490 Malignant neoplasm of unspecified part of unspecified bronchus or lung: Secondary | ICD-10-CM

## 2014-03-11 DIAGNOSIS — Z510 Encounter for antineoplastic radiation therapy: Secondary | ICD-10-CM | POA: Diagnosis not present

## 2014-03-12 ENCOUNTER — Ambulatory Visit
Admission: RE | Admit: 2014-03-12 | Discharge: 2014-03-12 | Disposition: A | Discharge status: Home / Self Care | Payer: Medicare Other | Source: Ambulatory Visit | Attending: Radiation Oncology | Admitting: Radiation Oncology

## 2014-03-12 DIAGNOSIS — C3490 Malignant neoplasm of unspecified part of unspecified bronchus or lung: Secondary | ICD-10-CM

## 2014-03-12 DIAGNOSIS — Z510 Encounter for antineoplastic radiation therapy: Secondary | ICD-10-CM | POA: Diagnosis not present

## 2014-03-13 ENCOUNTER — Ambulatory Visit
Admission: RE | Admit: 2014-03-13 | Discharge: 2014-03-13 | Disposition: A | Discharge status: Home / Self Care | Payer: Medicare Other | Source: Ambulatory Visit | Attending: Radiation Oncology | Admitting: Radiation Oncology

## 2014-03-13 DIAGNOSIS — Z510 Encounter for antineoplastic radiation therapy: Secondary | ICD-10-CM | POA: Diagnosis not present

## 2014-03-16 ENCOUNTER — Ambulatory Visit
Admission: RE | Admit: 2014-03-16 | Discharge: 2014-03-16 | Disposition: A | Discharge status: Home / Self Care | Payer: Medicare Other | Source: Ambulatory Visit | Attending: Radiation Oncology | Admitting: Radiation Oncology

## 2014-03-16 DIAGNOSIS — Z510 Encounter for antineoplastic radiation therapy: Secondary | ICD-10-CM | POA: Diagnosis not present

## 2014-03-17 ENCOUNTER — Ambulatory Visit
Admission: RE | Admit: 2014-03-17 | Discharge: 2014-03-17 | Disposition: A | Discharge status: Home / Self Care | Payer: Medicare Other | Source: Ambulatory Visit | Attending: Radiation Oncology | Admitting: Radiation Oncology

## 2014-03-17 ENCOUNTER — Encounter: Payer: Self-pay | Admitting: Radiation Oncology

## 2014-03-17 VITALS — BP 129/80 | HR 96 | Temp 98.4°F | Resp 20 | Ht 68.0 in | Wt 117.3 lb

## 2014-03-17 DIAGNOSIS — C3491 Malignant neoplasm of unspecified part of right bronchus or lung: Secondary | ICD-10-CM

## 2014-03-17 DIAGNOSIS — Z510 Encounter for antineoplastic radiation therapy: Secondary | ICD-10-CM | POA: Diagnosis not present

## 2014-03-17 NOTE — Progress Notes (Signed)
  Radiation Oncology         [phone] ________________________________  Name: Olivia Nichols MRN: [id number]  Date: 03/17/2014  DOB: [birth year]  Weekly Radiation Therapy Management  Current Dose: 45 Gy     Planned Dose:  50 Gy  Narrative . . . . . . . . The patient presents for routine under treatment assessment.                                   The patient is without complaint. No swallowing difficulties or new breathing problems. Patient denies any significant fatigue                                 Set-up films were reviewed.                                 The chart was checked. Physical Findings. . the lungs are clear except for mild bi-basilar crackles. the heart has a regular rhythm and rate Impression . . . . . . . The patient is tolerating radiation. Plan . . . . . . . . . . . . Continue treatment as planned.  ________________________________   Blair Promise, PhD, MD

## 2014-03-17 NOTE — Progress Notes (Signed)
Olivia Nichols has completed 9 fractions to her right lung.  She denies pain, shortness of breath, fatigue, cough and sore throat.  Her skin on her right chest and back is intact.  The lacerations on her left upper arm and right hand are healing and scabbed.

## 2014-03-18 ENCOUNTER — Ambulatory Visit
Admission: RE | Admit: 2014-03-18 | Discharge: 2014-03-18 | Disposition: A | Discharge status: Home / Self Care | Payer: Medicare Other | Source: Ambulatory Visit | Attending: Radiation Oncology | Admitting: Radiation Oncology

## 2014-03-18 DIAGNOSIS — Z510 Encounter for antineoplastic radiation therapy: Secondary | ICD-10-CM | POA: Diagnosis not present

## 2014-03-18 DIAGNOSIS — C3491 Malignant neoplasm of unspecified part of right bronchus or lung: Secondary | ICD-10-CM

## 2014-04-05 ENCOUNTER — Encounter: Payer: Self-pay | Admitting: Radiation Oncology

## 2014-04-05 NOTE — Progress Notes (Signed)
  Radiation Oncology         [phone] ________________________________  Name: Melicia Esqueda MRN: [id number]  Date: 04/05/2014  DOB: 03-13-1946  End of Treatment Note  Diagnosis:     Clinical stage I lung cancer presenting in the right upper lung field  Indication for treatment:  Definitive treatment       Radiation treatment dates:   October 1 through October 14  Site/dose:   Right upper lung nodule 50 gray in 10 fractions  Beams/energy:   Intensity modulated radiation therapy, 2 rapid arc fields, 6MV photons  Narrative: The patient tolerated radiation treatment relatively well.   She did not experience any esophageal symptoms breathing problems or significant fatigue  Plan: The patient has completed radiation treatment. The patient will return to radiation oncology clinic for routine followup in one month. I advised them to call or return sooner if they have any questions or concerns related to their recovery or treatment.  -----------------------------------  Blair Promise, PhD, MD

## 2014-04-20 ENCOUNTER — Encounter: Payer: Self-pay | Admitting: Oncology

## 2014-04-20 ENCOUNTER — Ambulatory Visit
Admission: RE | Admit: 2014-04-20 | Discharge: 2014-04-20 | Disposition: A | Discharge status: Home / Self Care | Payer: Medicare Other | Source: Ambulatory Visit | Attending: Radiation Oncology | Admitting: Radiation Oncology

## 2014-04-20 VITALS — BP 121/70 | HR 99 | Temp 98.2°F | Resp 20 | Ht 68.0 in | Wt 116.2 lb

## 2014-04-20 DIAGNOSIS — C3491 Malignant neoplasm of unspecified part of right bronchus or lung: Secondary | ICD-10-CM

## 2014-04-20 HISTORY — DX: Reserved for concepts with insufficient information to code with codable children: IMO0002

## 2014-04-20 HISTORY — DX: Reserved for inherently not codable concepts without codable children: IMO0001

## 2014-04-20 NOTE — Progress Notes (Signed)
Olivia Nichols here for follow up after treatment to her right upper lung.  She is in a wheelchair today.  She reports a shooting pain in her lower back when she leans over.  She said it started 2-3 months ago and had gone away but came back when she fell in the dressing room.  She is rating it at a 10/10 and is not taking any pain medication.  She denies cough, hemoptysis, fatigue and skin irritation.  She reports shortness of breath with activity.  Her oxygen saturation today on room air was 97%.

## 2014-04-20 NOTE — Progress Notes (Signed)
Radiation Oncology         [phone] ________________________________  Name: Olivia Nichols MRN: [id number]  Date: 04/20/2014  DOB: Aug 31, 1945  Follow-Up Visit Note  CC: Dorise Hiss, MD    ICD-9-CM ICD-10-CM   1. Squamous cell lung cancer, right 162.9 C34.91     Diagnosis:   Clinical stage I lung cancer presenting in the right upper lung field  Interval Since Last Radiation:  1  months  Narrative:  The patient returns today for routine follow-up.  She is doing well this time. She denies any residual fatigue cough or breathing problems. She denies any hemoptysis or chest pain.                              ALLERGIES:  is allergic to codeine; ibuprofen; morphine and related; tramadol; and trazodone and nefazodone.  Meds: Current Outpatient Prescriptions  Medication Sig Dispense Refill  . albuterol (PROVENTIL HFA;VENTOLIN HFA) 108 (90 BASE) MCG/ACT inhaler Inhale 2 puffs into the lungs every 4 (four) hours as needed for wheezing.    [name] albuterol (PROVENTIL) (2.5 MG/3ML) 0.083% nebulizer solution Take 2.5 mg by nebulization every 6 (six) hours as needed for wheezing or shortness of breath.    [name] amitriptyline (ELAVIL) 25 MG tablet Take 25 mg by mouth at bedtime.    [name] aspirin 325 MG tablet Take 325-650 mg by mouth daily. For pain    . bethanechol (URECHOLINE) 25 MG tablet Take 2 tablets (50 mg total) by mouth 3 (three) times daily. 180 tablet 1  . clonazePAM (KLONOPIN) 1 MG tablet Take 0.5 tablets (0.5 mg total) by mouth 2 (two) times daily as needed for anxiety.    . fenofibrate 160 MG tablet Take 160 mg by mouth daily.    . Fluticasone Furoate-Vilanterol (BREO ELLIPTA) 100-25 MCG/INH AEPB Inhale 1 puff into the lungs daily.    . furosemide (LASIX) 20 MG tablet Take 20 mg by mouth daily.    [name] losartan (COZAAR) 100 MG tablet Take 100 mg by mouth daily.    . pravastatin (PRAVACHOL) 80 MG tablet Take 1 tablet (80 mg total) by mouth at bedtime. 30 tablet 3  .  predniSONE (DELTASONE) 5 MG tablet Take 10 mg by mouth daily with breakfast.    . budesonide-formoterol (SYMBICORT) 160-4.5 MCG/ACT inhaler Inhale 2 puffs into the lungs 2 (two) times daily. 1 Inhaler 12  . tiZANidine (ZANAFLEX) 4 MG tablet Take 0.5 tablets (2 mg total) by mouth every 8 (eight) hours as needed for muscle spasms.    . [DISCONTINUED] lisinopril (PRINIVIL,ZESTRIL) 20 MG tablet Take 20 mg by mouth daily.     No current facility-administered medications for this encounter.    Physical Findings: The patient is in no acute distress. Patient is alert and oriented.  height is 5\' 8"  (1.727 m) and weight is 116 lb 3.2 oz (52.708 kg). Her oral temperature is 98.2 F (36.8 C). Her blood pressure is 121/70 and her pulse is 99. Her respiration is 20 and oxygen saturation is 97%. .  No palpable supraclavicular or axillary adenopathy. The lungs are clear except for bibasilar crackles. The heart has regular rhythm and rate.  Lab Findings: Lab Results  Component Value Date   WBC 4.6 12/23/2013   HGB 10.4* 12/23/2013   HCT 33.3* 12/23/2013   MCV 87.6 12/23/2013   PLT 291 12/23/2013    Radiographic Findings: No results found.  Impression:  The patient is doing well at this time without any residual side effects from her treatments  Plan:  Routine followup in 3 months. After this appointment the patient will be scheduled for a CT scan of the chest to assess her response to treatment.  ____________________________________ Blair Promise, MD

## 2014-04-27 ENCOUNTER — Encounter: Payer: Self-pay | Admitting: *Deleted

## 2014-04-27 NOTE — [facility] Oncology Navigator Note (Signed)
Called patient to check in.  Patient's daughter reported that her mother was lying down because her back is bothering her.  She reports that her recent visit with Dr. Sondra Come was good and that she is stable.  We reviewed that she is to return to see him in 3 months.  Patient's daughter took my contact information and will have her mother call me tomorrow.

## 2014-05-14 ENCOUNTER — Encounter (HOSPITAL_COMMUNITY): Payer: Self-pay | Admitting: Interventional Cardiology

## 2014-05-21 ENCOUNTER — Ambulatory Visit: Payer: Medicare Other | Admitting: Radiation Oncology

## 2014-07-15 ENCOUNTER — Encounter: Payer: Self-pay | Admitting: *Deleted

## 2014-07-15 NOTE — [facility] Oncology Navigator Note (Signed)
I called patient to check in.  I spoke to patient's daughter who said Olivia Nichols is doing well with no complaints.  I reminded her of her upcoming appointment with Dr. Sondra Come and gave her my contact information and encouraged her to call me for any needs.

## 2014-07-27 ENCOUNTER — Ambulatory Visit
Admission: RE | Admit: 2014-07-27 | Discharge: 2014-07-27 | Disposition: A | Discharge status: Home / Self Care | Payer: Medicare Other | Source: Ambulatory Visit | Attending: Radiation Oncology | Admitting: Radiation Oncology

## 2014-07-27 ENCOUNTER — Encounter: Payer: Self-pay | Admitting: Radiation Oncology

## 2014-07-27 VITALS — BP 127/71 | HR 101 | Temp 98.1°F | Resp 16 | Ht 68.0 in | Wt 120.7 lb

## 2014-07-27 DIAGNOSIS — C3491 Malignant neoplasm of unspecified part of right bronchus or lung: Secondary | ICD-10-CM

## 2014-07-27 NOTE — Progress Notes (Signed)
Radiation Oncology         [phone] ________________________________  Name: Olivia Nichols MRN: [id number]  Date: 07/27/2014  DOB: 18-Apr-1946  Follow-Up Visit Note  CC: Rachelle Hora, PA-C    ICD-9-CM ICD-10-CM   1. 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  She is doing well and without complaints. She continues to use supplemental oxygen at night but none really during the daytime. She denies any pain within the chest area significant cough or hemoptysis. She denies any new bony pain headaches dizziness or blurred vision.                              ALLERGIES:  is allergic to codeine; ibuprofen; morphine and related; tramadol; and trazodone and nefazodone.  Meds: Current Outpatient Prescriptions  Medication Sig Dispense Refill  . albuterol (PROVENTIL HFA;VENTOLIN HFA) 108 (90 BASE) MCG/ACT inhaler Inhale 2 puffs into the lungs every 4 (four) hours as needed for wheezing.    [name] albuterol (PROVENTIL) (2.5 MG/3ML) 0.083% nebulizer solution Take 2.5 mg by nebulization every 6 (six) hours as needed for wheezing or shortness of breath.    [name] amitriptyline (ELAVIL) 25 MG tablet Take 25 mg by mouth at bedtime.    . budesonide-formoterol (SYMBICORT) 160-4.5 MCG/ACT inhaler Inhale 2 puffs into the lungs 2 (two) times daily. 1 Inhaler 12  . clonazePAM (KLONOPIN) 1 MG tablet Take 0.5 tablets (0.5 mg total) by mouth 2 (two) times daily as needed for anxiety.    . fenofibrate 160 MG tablet Take 160 mg by mouth daily.    . furosemide (LASIX) 20 MG tablet Take 20 mg by mouth daily.    [name] losartan (COZAAR) 100 MG tablet Take 100 mg by mouth daily.    [name] oxyCODONE-acetaminophen (PERCOCET/ROXICET) 5-325 MG per tablet Take 1 tablet by  mouth every 4 (four) hours as needed for severe pain (takes 1/2 tablet).    . pravastatin (PRAVACHOL) 80 MG tablet Take 1 tablet (80 mg total) by mouth at bedtime. 30 tablet 3  . predniSONE (DELTASONE) 5 MG tablet Take 10 mg by mouth daily with breakfast.    . tiZANidine (ZANAFLEX) 4 MG tablet Take 0.5 tablets (2 mg total) by mouth every 8 (eight) hours as needed for muscle spasms.    [name] aspirin 325 MG tablet Take 325-650 mg by mouth daily. For pain    . bethanechol (URECHOLINE) 25 MG tablet Take 2 tablets (50 mg total) by mouth 3 (three) times daily. 180 tablet 1  . Fluticasone Furoate-Vilanterol (BREO ELLIPTA) 100-25 MCG/INH AEPB Inhale 1 puff into the lungs daily.    . [DISCONTINUED] lisinopril (PRINIVIL,ZESTRIL) 20 MG tablet Take 20 mg by mouth daily.     No current facility-administered medications for this encounter.    Physical Findings: The patient is in no acute distress. Patient is alert and oriented.  height is 5\' 8"  (1.727 m) and weight is 120 lb 11.2 oz (54.749 kg). Her oral temperature is 98.1 F (36.7 C). Her blood pressure is 127/71 and her pulse is 101. Her respiration is 16 and oxygen saturation is 96%. .  No palpable supraclavicular  or axillary adenopathy. The lungs are clear to auscultation. The heart has a regular rhythm and rate.  Lab Findings: Lab Results  Component Value Date   WBC 4.6 12/23/2013   HGB 10.4* 12/23/2013   HCT 33.3* 12/23/2013   MCV 87.6 12/23/2013   PLT 291 12/23/2013    Radiographic Findings: No results found.  Impression:  No evidence for recurrence on clinical exam today  Plan:  Routine follow-up in 6 months. The patient will be scheduled for chest CT scan in the near future to assess her response to SBRT.  ____________________________________ Blair Promise, MD

## 2014-07-27 NOTE — Progress Notes (Signed)
Olivia Nichols here for follow up after treatment to her right upper lung.  She continues to have pain in her lower back that she is rating at a 5/10.  She reports feeling something pop in her back when bending over about a year ago.  She is taking 1/2 of a percocet tablet three times a day for the pain.  She denies shortness of breath.  She does use 2 l of oxygen at night.  She reports a productive cough with white sputum.  She denies hemoptysis.  She reports an occasional sore throat.  She denies trouble swallowing.  She reports that she does not walk very much or do much activity.  She just walks in the house.  She is wondering when she will have a CT scan of her chest.  BP 127/71 mmHg  Pulse 101  Temp(Src) 98.1 F (36.7 C) (Oral)  Resp 16  Ht 5\' 8"  (1.727 m)  Wt 120 lb 11.2 oz (54.749 kg)  BMI 18.36 kg/m2  SpO2 96%

## 2014-07-28 ENCOUNTER — Telehealth: Payer: Self-pay | Admitting: *Deleted

## 2014-07-28 NOTE — Telephone Encounter (Signed)
CALLED PATIENT TO INFORM OF LAB AND TEST FOR 08-04-14, LVM FOR A RETURN CALL

## 2014-08-04 ENCOUNTER — Ambulatory Visit
Admission: RE | Admit: 2014-08-04 | Discharge: 2014-08-04 | Disposition: A | Discharge status: Home / Self Care | Payer: Medicare Other | Source: Ambulatory Visit | Attending: Radiation Oncology | Admitting: Radiation Oncology

## 2014-08-04 ENCOUNTER — Encounter (HOSPITAL_COMMUNITY): Payer: Self-pay

## 2014-08-04 ENCOUNTER — Ambulatory Visit (HOSPITAL_COMMUNITY)
Admission: RE | Admit: 2014-08-04 | Discharge: 2014-08-04 | Disposition: A | Discharge status: Home / Self Care | Payer: Medicare Other | Source: Ambulatory Visit | Attending: Radiation Oncology | Admitting: Radiation Oncology

## 2014-08-04 DIAGNOSIS — F17200 Nicotine dependence, unspecified, uncomplicated: Secondary | ICD-10-CM | POA: Diagnosis not present

## 2014-08-04 DIAGNOSIS — Z923 Personal history of irradiation: Secondary | ICD-10-CM | POA: Diagnosis not present

## 2014-08-04 DIAGNOSIS — C3491 Malignant neoplasm of unspecified part of right bronchus or lung: Secondary | ICD-10-CM | POA: Diagnosis not present

## 2014-08-04 DIAGNOSIS — J449 Chronic obstructive pulmonary disease, unspecified: Secondary | ICD-10-CM | POA: Diagnosis not present

## 2014-08-04 DIAGNOSIS — Z510 Encounter for antineoplastic radiation therapy: Secondary | ICD-10-CM | POA: Insufficient documentation

## 2014-08-04 DIAGNOSIS — E785 Hyperlipidemia, unspecified: Secondary | ICD-10-CM | POA: Diagnosis not present

## 2014-08-04 DIAGNOSIS — I10 Essential (primary) hypertension: Secondary | ICD-10-CM | POA: Insufficient documentation

## 2014-08-04 LAB — BUN AND CREATININE (CC13)
BUN: 12.1 mg/dL (ref 7.0–26.0)
Creatinine: 0.9 mg/dL (ref 0.6–1.1)
EGFR: 65 mL/min/{1.73_m2} — ABNORMAL LOW (ref >90)

## 2014-08-04 MED ORDER — IOHEXOL 300 MG/ML  SOLN
80.0000 mL | Freq: Once | INTRAMUSCULAR | Status: AC | PRN
  Administered 2014-08-04: 80 mL via INTRAVENOUS

## 2014-08-11 ENCOUNTER — Telehealth: Payer: Self-pay | Admitting: Oncology

## 2014-08-11 ENCOUNTER — Encounter: Payer: Self-pay | Admitting: Radiation Oncology

## 2014-08-11 ENCOUNTER — Other Ambulatory Visit: Payer: Self-pay | Admitting: Radiation Oncology

## 2014-08-11 MED ORDER — LEVOFLOXACIN 500 MG PO TABS: 500.0000 mg | ORAL_TABLET | Freq: Every day | ORAL | Status: AC

## 2014-08-11 NOTE — Telephone Encounter (Signed)
Olivia Nichols daughter, called asking for the results on the CT scan from 08/04/14.  Advised her that Dr. Sondra Come would need to review the results and we would call her back.  Anderson Malta said to call her back at [phone] or her sister Almyra Free at [phone].

## 2014-08-11 NOTE — Progress Notes (Signed)
Progress note  Patient under went a chest CT scan recently to assess her response to her SBRT directed the right upper lung lesion. This responded well to her radiation therapy however the patient was noted to have a possible recurrence or pneumonia in the right lower lung. Patient has been coughing a lot and will be placed on antibiotics for 7 day course.  The CT scan also showed right hilar adenopathy and a possible third lesion in the left lower lung. Patient may require PET scan for further evaluation of these findings. The patient will be seen for follow-up in approximately 3 weeks after she has completed her antibiotic therapy.  Blair Promise, MD

## 2014-08-12 ENCOUNTER — Telehealth: Payer: Self-pay | Admitting: Oncology

## 2014-08-12 NOTE — Telephone Encounter (Signed)
[facility] to see if they received the Levaquin prescription.  They said that they did receive it. When they went to run it, it showed that it was already filled somewhere else.  They requested that the family call them to make sure they had not already picked up the prescription at another pharmacy.  Called Yarden's daughter Almyra Free back and requested that she call [place] and call us back.

## 2014-08-12 NOTE — Telephone Encounter (Signed)
Cattleya's daughter, Almyra Free, called and said an antibiotic was going to be sent to [facility] per Dr. Sondra Come.  Almyra Free said it is not at the pharmacy yet.  Advised her that Dr. Sondra Come will be notified and we will call her back.

## 2014-08-12 NOTE — Telephone Encounter (Signed)
Almyra Free called back and said she call the pharmacy and was told the medication they were not able to fill was prednisone.  They said they did not have a prescription for Levaquin.  [facility] again and Joycelyn Schmid there said they did receive the prescription this morning.  Called Almyra Free back and let her know the prescription is ready for pickup.

## 2014-09-11 ENCOUNTER — Telehealth: Payer: Self-pay | Admitting: Oncology

## 2014-09-11 NOTE — Telephone Encounter (Signed)
Left a message for Anderson Malta regarding [place] appointment for Monday.  Requested a return call.

## 2014-09-14 ENCOUNTER — Ambulatory Visit: Admission: RE | Admit: 2014-09-14 | Payer: Medicare Other | Source: Ambulatory Visit | Admitting: Radiation Oncology

## 2014-09-14 ENCOUNTER — Ambulatory Visit: Payer: Medicare Other | Admitting: Radiation Oncology

## 2014-09-14 ENCOUNTER — Telehealth: Payer: Self-pay | Admitting: Oncology

## 2014-09-14 NOTE — Telephone Encounter (Signed)
Left a message for Anderson Malta regarding Ellora's follow up appointment with Dr. Sondra Come today.  Requested a return call.

## 2014-11-19 ENCOUNTER — Emergency Department (HOSPITAL_COMMUNITY): Payer: Medicare Other

## 2014-11-19 ENCOUNTER — Emergency Department (HOSPITAL_COMMUNITY)
Admission: EM | Admit: 2014-11-19 | Discharge: 2014-11-19 | Disposition: A | Discharge status: Other Acute Inpatient Hospital | Payer: Medicare Other | Attending: Emergency Medicine | Admitting: Emergency Medicine

## 2014-11-19 DIAGNOSIS — Z85118 Personal history of other malignant neoplasm of bronchus and lung: Secondary | ICD-10-CM | POA: Insufficient documentation

## 2014-11-19 DIAGNOSIS — J441 Chronic obstructive pulmonary disease with (acute) exacerbation: Secondary | ICD-10-CM | POA: Insufficient documentation

## 2014-11-19 DIAGNOSIS — Z7952 Long term (current) use of systemic steroids: Secondary | ICD-10-CM | POA: Insufficient documentation

## 2014-11-19 DIAGNOSIS — I10 Essential (primary) hypertension: Secondary | ICD-10-CM | POA: Diagnosis not present

## 2014-11-19 DIAGNOSIS — M069 Rheumatoid arthritis, unspecified: Secondary | ICD-10-CM | POA: Diagnosis not present

## 2014-11-19 DIAGNOSIS — Z85828 Personal history of other malignant neoplasm of skin: Secondary | ICD-10-CM | POA: Diagnosis not present

## 2014-11-19 DIAGNOSIS — Z7951 Long term (current) use of inhaled steroids: Secondary | ICD-10-CM | POA: Insufficient documentation

## 2014-11-19 DIAGNOSIS — I2510 Atherosclerotic heart disease of native coronary artery without angina pectoris: Secondary | ICD-10-CM | POA: Diagnosis not present

## 2014-11-19 DIAGNOSIS — D72829 Elevated white blood cell count, unspecified: Secondary | ICD-10-CM | POA: Insufficient documentation

## 2014-11-19 DIAGNOSIS — E785 Hyperlipidemia, unspecified: Secondary | ICD-10-CM | POA: Insufficient documentation

## 2014-11-19 DIAGNOSIS — Z7982 Long term (current) use of aspirin: Secondary | ICD-10-CM | POA: Diagnosis not present

## 2014-11-19 DIAGNOSIS — R0600 Dyspnea, unspecified: Secondary | ICD-10-CM | POA: Diagnosis present

## 2014-11-19 DIAGNOSIS — Z87891 Personal history of nicotine dependence: Secondary | ICD-10-CM | POA: Diagnosis not present

## 2014-11-19 DIAGNOSIS — Z792 Long term (current) use of antibiotics: Secondary | ICD-10-CM | POA: Diagnosis not present

## 2014-11-19 DIAGNOSIS — Z923 Personal history of irradiation: Secondary | ICD-10-CM | POA: Diagnosis not present

## 2014-11-19 DIAGNOSIS — Z9889 Other specified postprocedural states: Secondary | ICD-10-CM | POA: Insufficient documentation

## 2014-11-19 DIAGNOSIS — R0603 Acute respiratory distress: Secondary | ICD-10-CM

## 2014-11-19 DIAGNOSIS — M1990 Unspecified osteoarthritis, unspecified site: Secondary | ICD-10-CM | POA: Insufficient documentation

## 2014-11-19 DIAGNOSIS — Z79899 Other long term (current) drug therapy: Secondary | ICD-10-CM | POA: Diagnosis not present

## 2014-11-19 LAB — I-STAT ARTERIAL BLOOD GAS, ED
Acid-Base Excess: 8 mmol/L — ABNORMAL HIGH (ref 0.0–2.0)
Bicarbonate: 35.5 mEq/L — ABNORMAL HIGH (ref 20.0–24.0)
O2 Saturation: 97 %
Patient temperature: 97.4
TCO2: 37 mmol/L (ref 0–100)
pCO2 arterial: 60.8 mmHg (ref 35.0–45.0)
pH, Arterial: 7.372 (ref 7.350–7.450)
pO2, Arterial: 98 mmHg (ref 80.0–100.0)

## 2014-11-19 LAB — CBC WITH DIFFERENTIAL/PLATELET
Basophils Absolute: 0 10*3/uL (ref 0.0–0.1)
Basophils Relative: 0 % (ref 0–1)
Eosinophils Absolute: 0 10*3/uL (ref 0.0–0.7)
Eosinophils Relative: 0 % (ref 0–5)
HCT: 39 % (ref 36.0–46.0)
Hemoglobin: 12.3 g/dL (ref 12.0–15.0)
Lymphocytes Relative: 1 % — ABNORMAL LOW (ref 12–46)
Lymphs Abs: 0.8 10*3/uL (ref 0.7–4.0)
MCH: 27.6 pg (ref 26.0–34.0)
MCHC: 31.5 g/dL (ref 30.0–36.0)
MCV: 87.6 fL (ref 78.0–100.0)
MONO ABS: 3.1 10*3/uL — AB (ref 0.1–1.0)
Monocytes Relative: 4 % (ref 3–12)
Neutro Abs: 72.5 10*3/uL — ABNORMAL HIGH (ref 1.7–7.7)
Neutrophils Relative %: 95 % — ABNORMAL HIGH (ref 43–77)
PLATELETS: 333 10*3/uL (ref 150–400)
RBC: 4.45 MIL/uL (ref 3.87–5.11)
RDW: 16.7 % — AB (ref 11.5–15.5)
WBC: 76.4 10*3/uL (ref 4.0–10.5)
nRBC: 1 /100 WBC — ABNORMAL HIGH

## 2014-11-19 LAB — COMPREHENSIVE METABOLIC PANEL
ALK PHOS: 201 U/L — AB (ref 38–126)
ALT: 9 U/L — ABNORMAL LOW (ref 14–54)
ANION GAP: 10 (ref 5–15)
AST: 41 U/L (ref 15–41)
Albumin: 3.4 g/dL — ABNORMAL LOW (ref 3.5–5.0)
BUN: 22 mg/dL — AB (ref 6–20)
CO2: 31 mmol/L (ref 22–32)
CREATININE: 0.67 mg/dL (ref 0.44–1.00)
Calcium: 9 mg/dL (ref 8.9–10.3)
Chloride: 94 mmol/L — ABNORMAL LOW (ref 101–111)
GFR calc non Af Amer: >60 mL/min (ref >60)
Glucose, Bld: 223 mg/dL — ABNORMAL HIGH (ref 65–99)
Potassium: 5.4 mmol/L — ABNORMAL HIGH (ref 3.5–5.1)
Sodium: 135 mmol/L (ref 135–145)
TOTAL PROTEIN: 6.8 g/dL (ref 6.5–8.1)
Total Bilirubin: 0.8 mg/dL (ref 0.3–1.2)

## 2014-11-19 LAB — BRAIN NATRIURETIC PEPTIDE: B NATRIURETIC PEPTIDE 5: 3902.7 pg/mL — AB (ref 0.0–100.0)

## 2014-11-19 MED ORDER — PIPERACILLIN-TAZOBACTAM 3.375 G IVPB: 3.3750 g | Freq: Three times a day (TID) | INTRAVENOUS | Status: DC

## 2014-11-19 MED ORDER — VANCOMYCIN HCL 10 G IV SOLR
1250.0000 mg | Freq: Once | INTRAVENOUS | Status: AC
  Administered 2014-11-19: 1250 mg via INTRAVENOUS
  Filled 2014-11-19: qty 1250

## 2014-11-19 MED ORDER — PIPERACILLIN-TAZOBACTAM 3.375 G IVPB 30 MIN
3.3750 g | INTRAVENOUS | Status: AC
  Administered 2014-11-19: 3.375 g via INTRAVENOUS
  Filled 2014-11-19: qty 50

## 2014-11-19 MED ORDER — FUROSEMIDE 10 MG/ML IJ SOLN: 40.0000 mg | Freq: Once | INTRAMUSCULAR | Status: DC

## 2014-11-19 MED ORDER — VANCOMYCIN HCL 10 G IV SOLR
20.0000 mg/kg | Freq: Once | INTRAVENOUS | Status: DC
  Filled 2014-11-19: qty 1100

## 2014-11-19 MED ORDER — METHYLPREDNISOLONE SODIUM SUCC 125 MG IJ SOLR: 125.0000 mg | Freq: Once | INTRAMUSCULAR | Status: DC

## 2014-11-19 NOTE — ED Notes (Signed)
Per [facility]: Pt from home. Per family, pt with increased resp. Distress today. Pt has hx of lung cancer, COPD, discharged yesterday from [facility] with dx pneumonia.  On EMS arrival, pt 60% on 3L [place]. Placed on CPAP increased to 90%. GCS 13. HR 113.

## 2014-11-19 NOTE — ED Notes (Signed)
Called [facility]-per RN

## 2014-11-19 NOTE — ED Notes (Signed)
Phlebotomy at bedside.

## 2014-11-19 NOTE — Progress Notes (Signed)
ANTIBIOTIC CONSULT NOTE - INITIAL  Pharmacy Consult for zosyn Indication: pneumonia  Allergies  Allergen Reactions  . Codeine Other (See Comments)    Passes out  . Ibuprofen     Patient states she passes out  . Morphine And Related Other (See Comments)    Patient assumes she is allergic to morphine because she is allergic to codeine  . Tramadol     Passes out  . Trazodone And Nefazodone Nausea And Vomiting    Patient Measurements: Height: '5\' 8"'$  (172.7 cm) Weight: 121 lb 4.1 oz (55 kg) IBW/kg (Calculated) : 63.9 Adjusted Body Weight:   Vital Signs: Temp: 97.4 F (36.3 C) (06/16 1652) Temp Source: Tympanic (06/16 1652) BP: 112/74 mmHg (06/16 1930) Pulse Rate: 98 (06/16 1930) Intake/Output from previous day:   Intake/Output from this shift:    Labs:  Recent Labs  11/19/14 1642  WBC 76.4*  HGB 12.3  PLT 333  CREATININE 0.67   Estimated Creatinine Clearance: 56 mL/min (by C-G formula based on Cr of 0.67). No results for input(s): VANCOTROUGH, VANCOPEAK, VANCORANDOM, GENTTROUGH, GENTPEAK, GENTRANDOM, TOBRATROUGH, TOBRAPEAK, TOBRARND, AMIKACINPEAK, AMIKACINTROU, AMIKACIN in the last 72 hours.   Microbiology: No results found for this or any previous visit (from the past 720 hour(s)).  Medical History: Past Medical History  Diagnosis Date  . Tobacco abuse     Current smoker  . COPD (chronic obstructive pulmonary disease)   . Neutropenia, unspecified   . Rheumatoid arthritis   . Osteoarthritis   . Hyperlipidemia     On pravastatin  . Essential hypertension, benign     On Losartan  . Coronary atherosclerosis of native coronary artery     CT angiography 08/2012 showed significantly coronary calcification. On ASA. Stess Test 03/06/13 LV EF = 68%. High risk study due to ischemic dilatation & mid to distal anterior ischemia.  [name] History of radiation therapy 05/19/13, 05/21/2013, 05/23/2013, 05/26/2013    SBRT right lower lung nodule, 50 gray  . Cancer 09/12/12   Squamous cell lung cancer. RIGHT LOWER LOBE PNA.  . Skin cancer 12/16/13    removed from left neck  . Radiation 03/05/14-03/18/14    right upper lung nodule 50 gray    Medications:  See EMR  Assessment: 69 yo female admitted for respiratory distress, has hx of lung cancer, recently discharged from outside hospital. WBC up to 76.4, Scr 0.6, eCrCl 50-55 ml/mi.  Goal of Therapy:  Resolution of infection  Plan:  -Zosyn 3.375 g IV q8h -Vancomycin 1250 mg IV x1 -Monitor renal fx, cultures, VT at steady state    Hughes Better, PharmD, BCPS Clinical Pharmacist Pager: [phone] 11/19/2014 8:00 PM

## 2014-11-19 NOTE — ED Notes (Signed)
Pt's tank top was cut by EMS, placed pt in gown, on monitor, continuous pulse oximetry and blood pressure cuff; pt arrived on cpap and being placed on bpap by RT whom is maintaining airway

## 2014-11-19 NOTE — ED Notes (Addendum)
Doctor along with ED Resident talking with multiple family members regarding plan of care and admission status to [facility] or [facility] where patient was discharged today.

## 2014-11-19 NOTE — ED Provider Notes (Signed)
CSN: [id number]     Arrival date & time 11/19/14  1642 History   First MD Initiated Contact with Patient 11/19/14 1656     Chief Complaint  Patient presents with  . Respiratory Distress     (Consider location/radiation/quality/duration/timing/severity/associated sxs/prior Treatment) Patient is a 69 y.o. female presenting with shortness of breath.  Shortness of Breath Severity:  Severe Onset quality:  Gradual Duration:  2 hours Timing:  Constant Progression:  Partially resolved (after being placed on CPAP) Chronicity:  Recurrent (Recently admitted to [facility] or respiratory distress. He was discharged today per family.) Context comment:  Family reports that the patient had an episode of coughing and this began hyperventilating and that increased work of breathing. Relieved by: Symptoms improved after being placed on CPAP. Worsened by:  Coughing Ineffective treatments: 3 L home oxygen. Associated symptoms: cough and sputum production   Associated symptoms: no abdominal pain, no chest pain, no ear pain, no fever, no headaches, no hemoptysis, no rash, no sore throat and no vomiting   Risk factors: hx of cancer     Past Medical History  Diagnosis Date  . Tobacco abuse     Current smoker  . COPD (chronic obstructive pulmonary disease)   . Neutropenia, unspecified   . Rheumatoid arthritis   . Osteoarthritis   . Hyperlipidemia     On pravastatin  . Essential hypertension, benign     On Losartan  . Coronary atherosclerosis of native coronary artery     CT angiography 08/2012 showed significantly coronary calcification. On ASA. Stess Test 03/06/13 LV EF = 68%. High risk study due to ischemic dilatation & mid to distal anterior ischemia.  [name] History of radiation therapy 05/19/13, 05/21/2013, 05/23/2013, 05/26/2013    SBRT right lower lung nodule, 50 gray  . Cancer 09/12/12    Squamous cell lung cancer. RIGHT LOWER LOBE PNA.  . Skin cancer 12/16/13    removed from left neck  .  Radiation 03/05/14-03/18/14    right upper lung nodule 50 gray   Past Surgical History  Procedure Laterality Date  . Lung biopsy  09/12/12    [place]  . Cardiac catheterization    . Cataract extraction Left   . Skin cancer excision      top lip, right side  . Left heart catheterization with coronary angiogram N/A 03/21/2013    Procedure: LEFT HEART CATHETERIZATION WITH CORONARY ANGIOGRAM;  Surgeon: Sinclair Grooms, MD;  Location: [facility] CATH LAB;  Service: Cardiovascular;  Laterality: N/A;   Family History  Problem Relation Age of Onset  . CAD Mother   . Peripheral vascular disease Mother   . CVA Father   . CAD Father   . COPD Brother   . Mitral valve prolapse Child   . Diabetes Child   . Mitral valve prolapse Child   . CAD Brother    History  Substance Use Topics  . Smoking status: Former Smoker -- 1.50 packs/day for 45 years    Types: Cigarettes    Quit date: 03/30/2013  . Smokeless tobacco: Not on file  . Alcohol Use: No   OB History    No data available     Review of Systems  Constitutional: Negative for fever, chills, appetite change and fatigue.  HENT: Negative for congestion, ear pain, facial swelling, mouth sores and sore throat.   Eyes: Negative for visual disturbance.  Respiratory: Positive for cough, sputum production and shortness of breath. Negative for hemoptysis and chest tightness.  Cardiovascular: Negative for chest pain and palpitations.  Gastrointestinal: Negative for nausea, vomiting, abdominal pain, diarrhea and blood in stool.  Endocrine: Negative for cold intolerance and heat intolerance.  Genitourinary: Negative for frequency, decreased urine volume and difficulty urinating.  Musculoskeletal: Negative for back pain and neck stiffness.  Skin: Negative for rash.  Neurological: Negative for dizziness, weakness, light-headedness and headaches.  All other systems reviewed and are negative.     Allergies  Codeine; Ibuprofen; Morphine  and related; Tramadol; and Trazodone and nefazodone  Home Medications   Prior to Admission medications   Medication Sig Start Date End Date Taking? Authorizing Provider  albuterol (PROVENTIL HFA;VENTOLIN HFA) 108 (90 BASE) MCG/ACT inhaler Inhale 2 puffs into the lungs every 4 (four) hours as needed for wheezing.    Historical Provider, MD  albuterol (PROVENTIL) (2.5 MG/3ML) 0.083% nebulizer solution Take 2.5 mg by nebulization every 6 (six) hours as needed for wheezing or shortness of breath.    Historical Provider, MD  amitriptyline (ELAVIL) 25 MG tablet Take 25 mg by mouth at bedtime.    Historical Provider, MD  aspirin 325 MG tablet Take 325-650 mg by mouth daily. For pain    Historical Provider, MD  bethanechol (URECHOLINE) 25 MG tablet Take 2 tablets (50 mg total) by mouth 3 (three) times daily. 12/24/13   Bobby Rumpf York, PA-C  budesonide-formoterol (SYMBICORT) 160-4.5 MCG/ACT inhaler Inhale 2 puffs into the lungs 2 (two) times daily. 12/24/13   Bobby Rumpf York, PA-C  clonazePAM (KLONOPIN) 1 MG tablet Take 0.5 tablets (0.5 mg total) by mouth 2 (two) times daily as needed for anxiety. 12/24/13   Bobby Rumpf York, PA-C  fenofibrate 160 MG tablet Take 160 mg by mouth daily.    Historical Provider, MD  Fluticasone Furoate-Vilanterol (BREO ELLIPTA) 100-25 MCG/INH AEPB Inhale 1 puff into the lungs daily.    Historical Provider, MD  furosemide (LASIX) 20 MG tablet Take 20 mg by mouth daily.    Historical Provider, MD  levofloxacin (LEVAQUIN) 500 MG tablet Take 1 tablet (500 mg total) by mouth daily. 08/11/14   Gery Pray, MD  losartan (COZAAR) 100 MG tablet Take 100 mg by mouth daily.    Historical Provider, MD  oxyCODONE-acetaminophen (PERCOCET/ROXICET) 5-325 MG per tablet Take 1 tablet by mouth every 4 (four) hours as needed for severe pain (takes 1/2 tablet).    Historical Provider, MD  pravastatin (PRAVACHOL) 80 MG tablet Take 1 tablet (80 mg total) by mouth at bedtime. 12/24/13   Bobby Rumpf York,  PA-C  predniSONE (DELTASONE) 5 MG tablet Take 10 mg by mouth daily with breakfast.    Historical Provider, MD  tiZANidine (ZANAFLEX) 4 MG tablet Take 0.5 tablets (2 mg total) by mouth every 8 (eight) hours as needed for muscle spasms. 12/24/13   Marianne L York, PA-C   BP 138/67 mmHg  Pulse 99  Temp(Src) 97.4 F (36.3 C) (Tympanic)  Resp 21  Ht '5\' 8"'$  (1.727 m)  Wt 121 lb 4.1 oz (55 kg)  BMI 18.44 kg/m2  SpO2 100% Physical Exam  Constitutional: She is oriented to person, place, and time. She appears well-developed and well-nourished. No distress.  Patient arrived on CPAP  HENT:  Head: Normocephalic and atraumatic.  Right Ear: External ear normal.  Left Ear: External ear normal.  Nose: Nose normal.  Eyes: Conjunctivae and EOM are normal. Pupils are equal, round, and reactive to light. Right eye exhibits no discharge. Left eye exhibits no discharge. No scleral icterus.  Neck: Normal  range of motion. Neck supple.  Cardiovascular: Normal rate, regular rhythm and normal heart sounds.  Exam reveals no gallop and no friction rub.   No murmur heard. Pulmonary/Chest: Accessory muscle usage present. No stridor. Tachypnea noted. She is in respiratory distress. She has no decreased breath sounds. She has no wheezes. She has no rhonchi. She has no rales.  On CPAP machine  Abdominal: Soft. She exhibits no distension. There is no tenderness.  Musculoskeletal: She exhibits no edema or tenderness.  Neurological: She is alert and oriented to person, place, and time. GCS eye subscore is 4. GCS verbal subscore is 5. GCS motor subscore is 6.  Skin: Skin is warm and dry. No rash noted. She is not diaphoretic. No erythema.  Psychiatric: She has a normal mood and affect.    ED Course  Procedures (including critical care time) Labs Review Labs Reviewed  CBC WITH DIFFERENTIAL/PLATELET - Abnormal; Notable for the following:    WBC 76.4 (*)    RDW 16.7 (*)    Neutrophils Relative % 95 (*)    Lymphocytes  Relative 1 (*)    nRBC 1 (*)    Neutro Abs 72.5 (*)    Monocytes Absolute 3.1 (*)    All other components within normal limits  COMPREHENSIVE METABOLIC PANEL - Abnormal; Notable for the following:    Potassium 5.4 (*)    Chloride 94 (*)    Glucose, Bld 223 (*)    BUN 22 (*)    Albumin 3.4 (*)    ALT 9 (*)    Alkaline Phosphatase 201 (*)    All other components within normal limits  BRAIN NATRIURETIC PEPTIDE - Abnormal; Notable for the following:    B Natriuretic Peptide 3902.7 (*)    All other components within normal limits  I-STAT ARTERIAL BLOOD GAS, ED - Abnormal; Notable for the following:    pCO2 arterial 60.8 (*)    Bicarbonate 35.5 (*)    Acid-Base Excess 8.0 (*)    All other components within normal limits  CULTURE, BLOOD (ROUTINE X 2)  CULTURE, BLOOD (ROUTINE X 2)    Imaging Review Dg Chest Portable 1 View  11/19/2014   CLINICAL DATA:  Respiratory distress, on BiPAP, history smoking, COPD, rheumatoid arthritis, hypertension, coronary artery disease, lung cancer post radiation therapy  EXAM: PORTABLE CHEST - 1 VIEW  COMPARISON:  Portable exam 1654 hours compared to 12/17/2013  FINDINGS: LEFT subclavian Port-A-Cath with tip projecting over RIGHT atrium.  Enlargement of cardiac silhouette.  Mediastinal contours stable with calcified tortuous thoracic aorta again noted.  Calcifications identified in the upper lobes bilaterally.  Persistent bibasilar atelectasis and scattered chronic interstitial disease.  No definite acute infiltrate, pleural effusion or pneumothorax.  IMPRESSION: Chronic lung changes.  Enlargement of cardiac silhouette.  No definite acute abnormalities.   Electronically Signed   By: Lavonia Dana M.D.   On: 11/19/2014 17:01     EKG Interpretation   Date/Time:  Thursday November 19 2014 16:43:16 EDT Ventricular Rate:  111 PR Interval:  118 QRS Duration: 83 QT Interval:  323 QTC Calculation: 439 R Axis:   28 Text Interpretation:  Atrial-paced complexes Consider  left ventricular  hypertrophy Nonspecific T abnormalities, lateral leads No significant  change since last tracing Confirmed by Debby Freiberg [phone]) on  11/19/2014 5:16:48 PM      MDM   69 year old female with a history of COPD, lung cancer currently on chemotherapy who was recently discharged from the hospital for respiratory distress present  see the in respiratory distress requiring CPAP. Per report patient was at home on 2 L home oxygen had a coughing episode and began having increased work of breathing that did not improve. On EMS arrival the patient was hypoxic with sats in the 50s. After being placed on CPAP and her saturations and mentation improved. She remained hemodynamically stable in route. On arrival patient was hemodynamically stable with increased work of breathing. Chest x-ray did not reveal any focal opacities concerning for pneumonia. ABG 7.3, PCO2 60.8, bicarbonate 35.5. CBC with leukocytosis at 76.4. CMP with mild hyperkalemia and elevated BUN. BNP is 3902.  Records from [facility] were obtained and patient was previously pancytopenic and being treated for presumed pneumonia. The patient was started on antibiotics here. She was also given Lasix and steroids. She was continued on BiPAP and had improved saturations to 100%. Talks with family regarding goals of care for indeterminant however they did request patient be transferred to [facility] as the doctors there and the patient more thoroughly. [facility] was contacted and accepted the patient.  Patient is stable and appropriate for transfer BiPAP machine.  Patient seen in conjunction with Dr. Colin Rhein.  Final diagnoses:  Respiratory distress  Leukocytosis       Addison Lank, MD 11/20/14 9038  Debby Freiberg, MD 11/20/14 [phone]

## 2014-11-19 NOTE — ED Notes (Signed)
Doctor at bedside.

## 2014-11-20 LAB — PATHOLOGIST SMEAR REVIEW

## 2014-11-24 LAB — CULTURE, BLOOD (ROUTINE X 2)
CULTURE: NO GROWTH
Culture: NO GROWTH

## 2014-12-04 DEATH — deceased

## 2014-12-16 IMAGING — CR DG CHEST 2V
1 series · 1 of 1 positions shown · non-contrast
Comparison: 12/14/2011

CLINICAL DATA: Syncope

EXAM:
CHEST  2 VIEW

[w chest lat *]
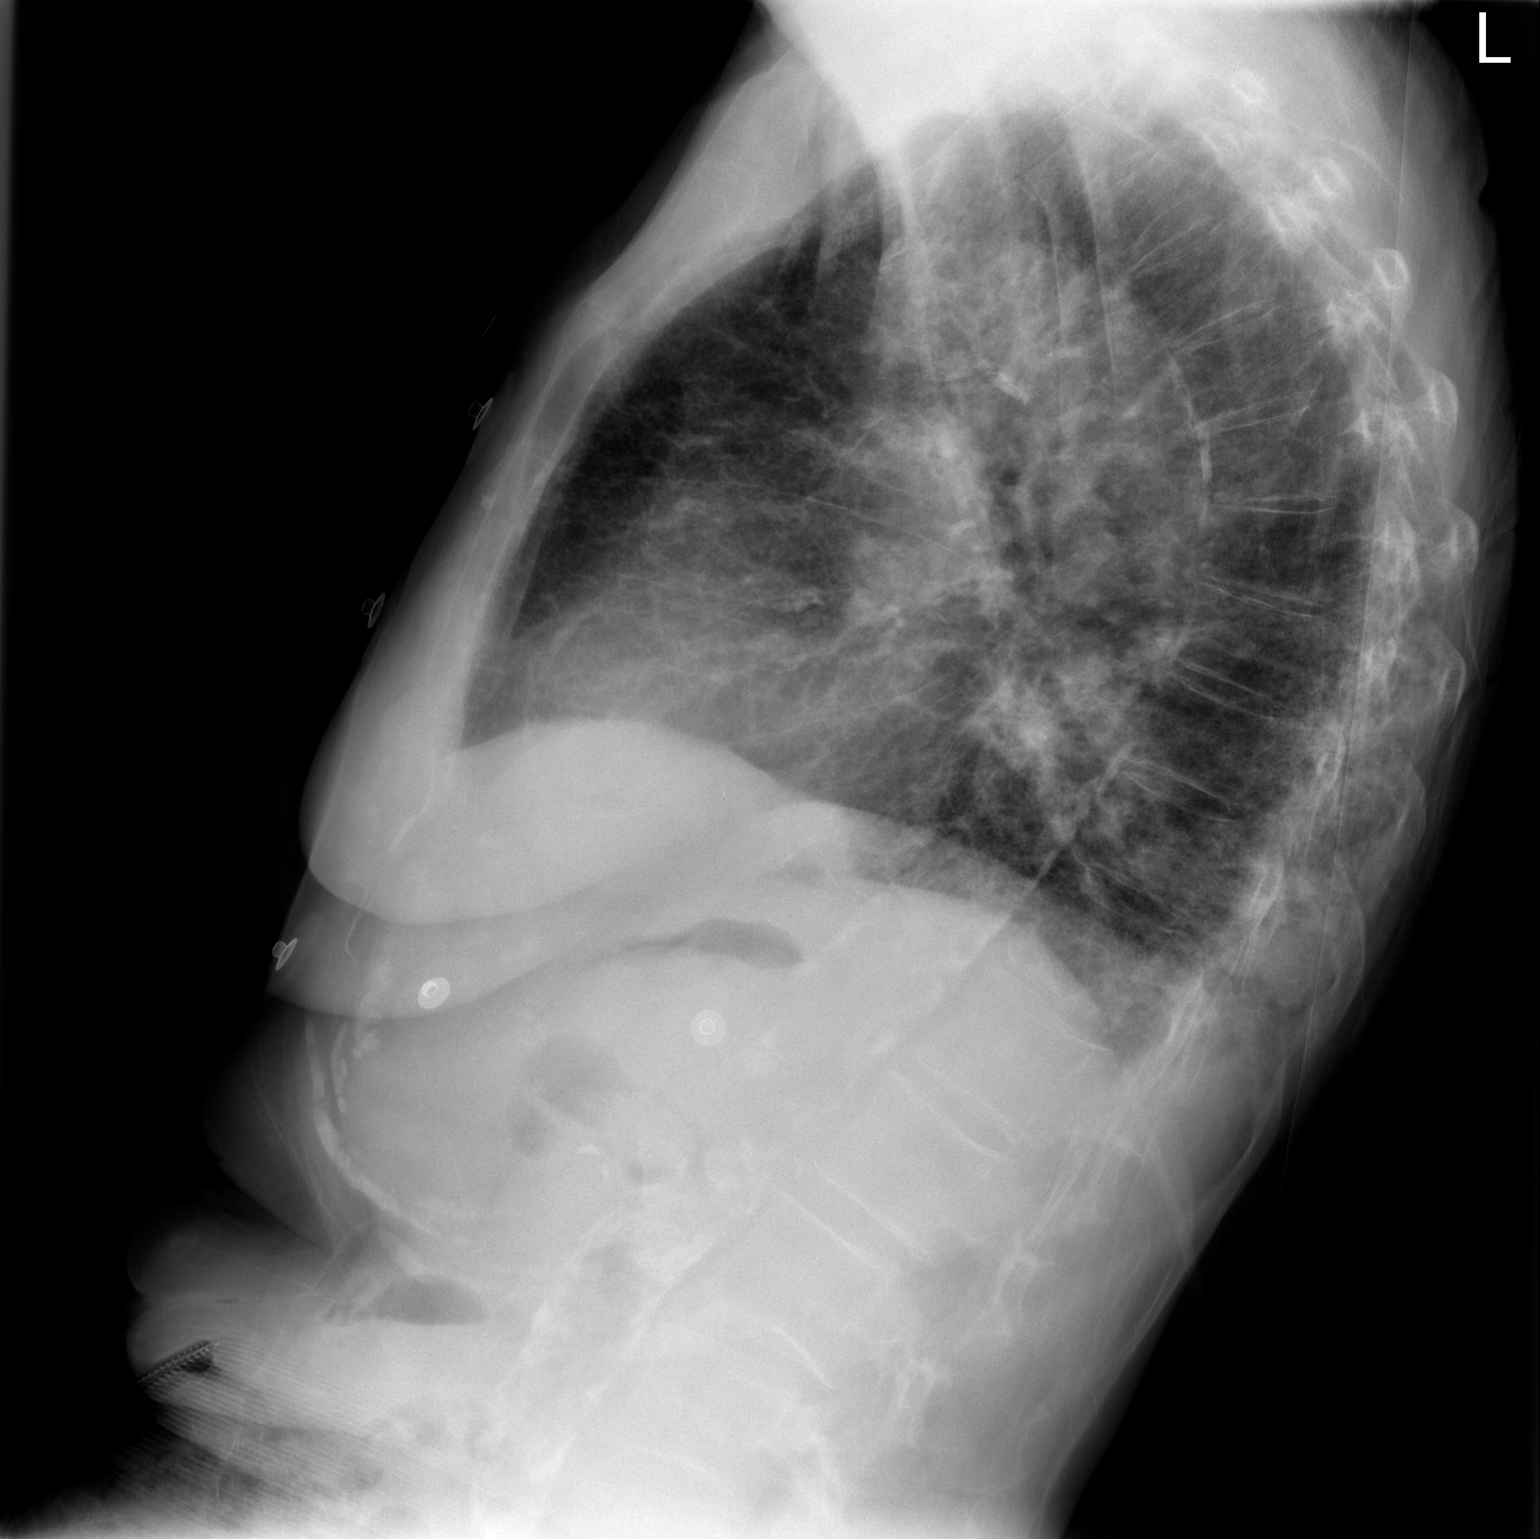

[1 of 1 positions shown; findings below may reference images not displayed]

FINDINGS: Chronic interstitial prominence throughout the lungs, stable.
Biapical scarring. Heart is normal size. No acute opacities. No
effusions. No acute bony abnormality.
IMPRESSION: Severe chronic interstitial lung disease. No acute findings.

## 2015-01-21 ENCOUNTER — Ambulatory Visit
Admission: RE | Admit: 2015-01-21 | Discharge: 2015-01-21 | Disposition: A | Discharge status: Home / Self Care | Payer: Medicaid Other | Source: Ambulatory Visit | Attending: Radiation Oncology | Admitting: Radiation Oncology

## 2015-01-21 ENCOUNTER — Telehealth: Payer: Self-pay | Admitting: *Deleted

## 2015-01-21 NOTE — Telephone Encounter (Signed)
Called patient home,spoke with the daughter,"My mother has passed away" 11:40 AM

## 2015-09-12 IMAGING — CR DG CHEST 1V PORT
1 series · 1 of 1 positions shown · non-contrast
Comparison: CT 11/20/2013.  Radiographs 03/22/2013.

CLINICAL DATA: Hypertension. Weakness. History of lung cancer with
radiation therapy.

EXAM:
PORTABLE CHEST - 1 VIEW

[AP]
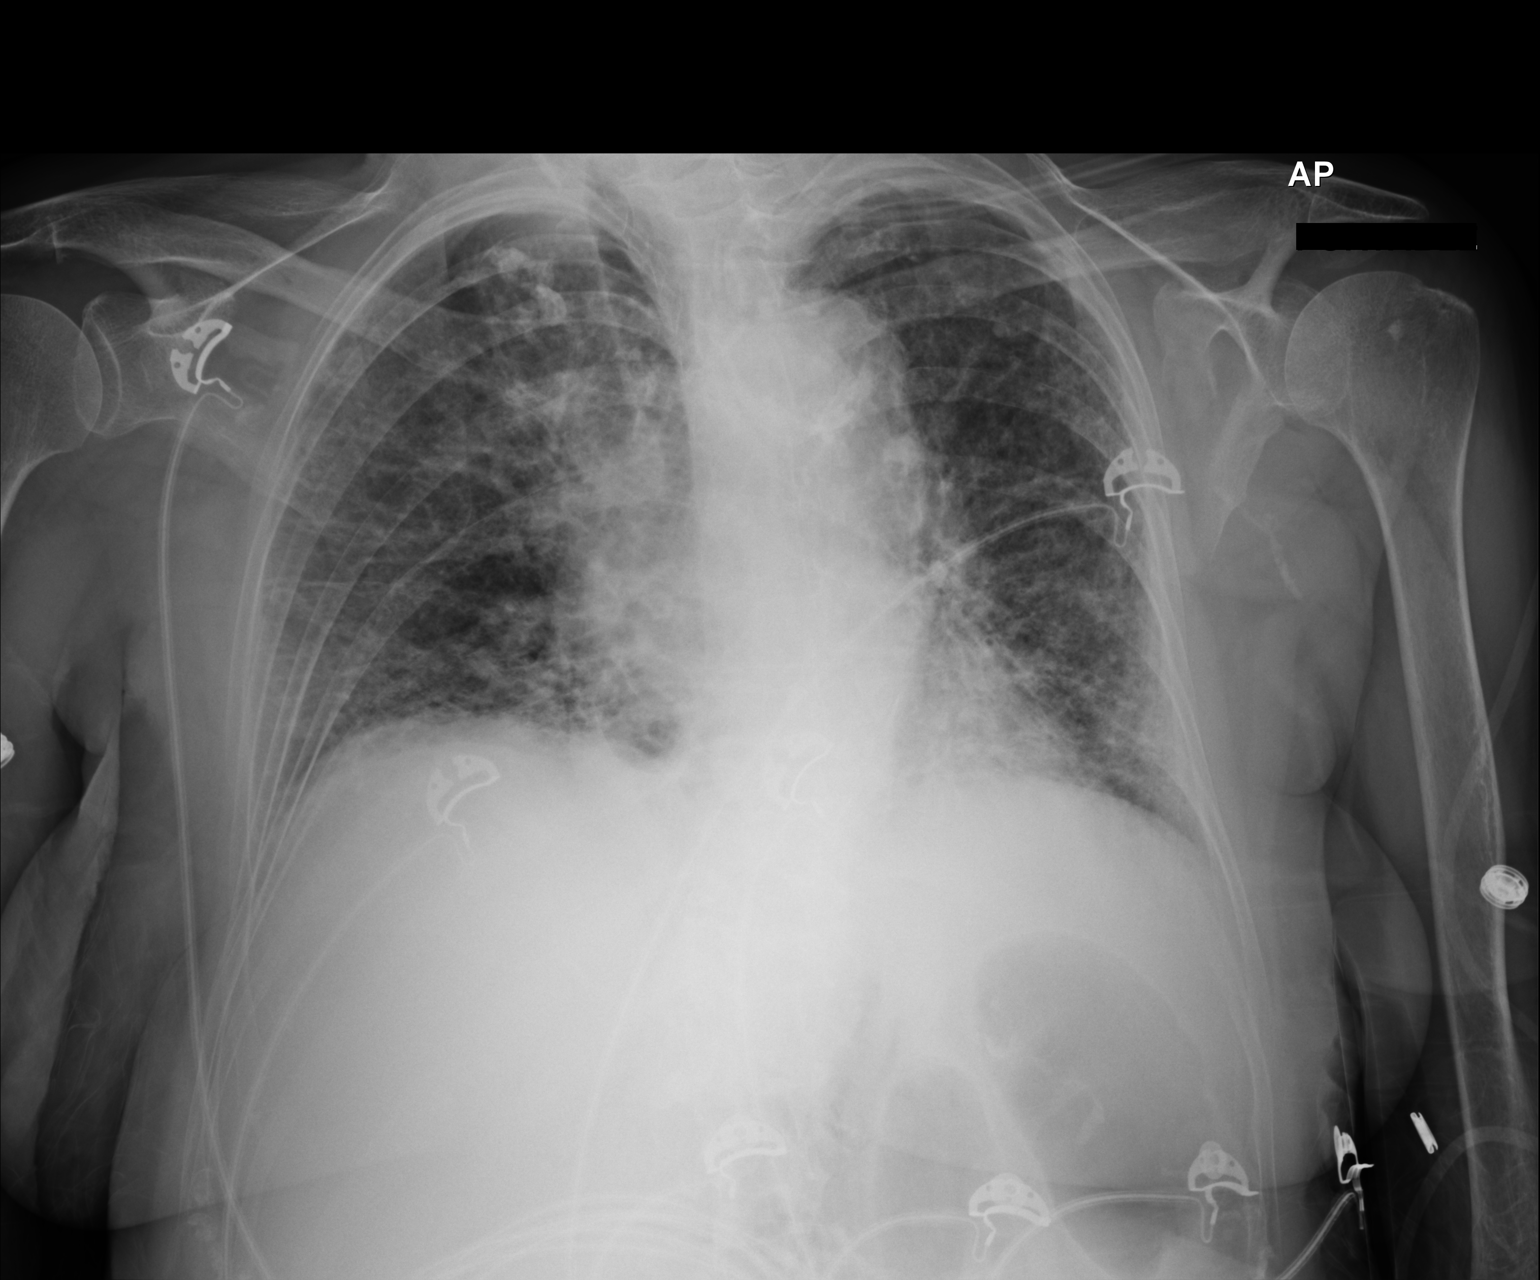

[1 of 1 positions shown; findings below may reference images not displayed]

FINDINGS: 3301 hr. There are lower lung volumes with progressive coarse
interstitial opacities throughout both lungs. There is probable
superimposed bibasilar atelectasis. Patient is rotated to the right.
Given this and the lower lung volumes, the heart size and
mediastinal contours are grossly stable without progressive
adenopathy. There is no pleural effusion or pneumothorax.
IMPRESSION: Much lower lung volumes with probable atelectasis superimposed on
chronic lung disease. Pulmonary edema and atypical inflammation
cannot be excluded.

## 2015-09-14 IMAGING — US US RENAL
1 series · 14 of 25 positions shown · non-contrast
Comparison: None.

CLINICAL DATA: Renal failure

EXAM:
RENAL/URINARY TRACT ULTRASOUND COMPLETE

[Series 1: us renal · 0.22mm/px · 14 of 26 slices shown]
[im 1/26]
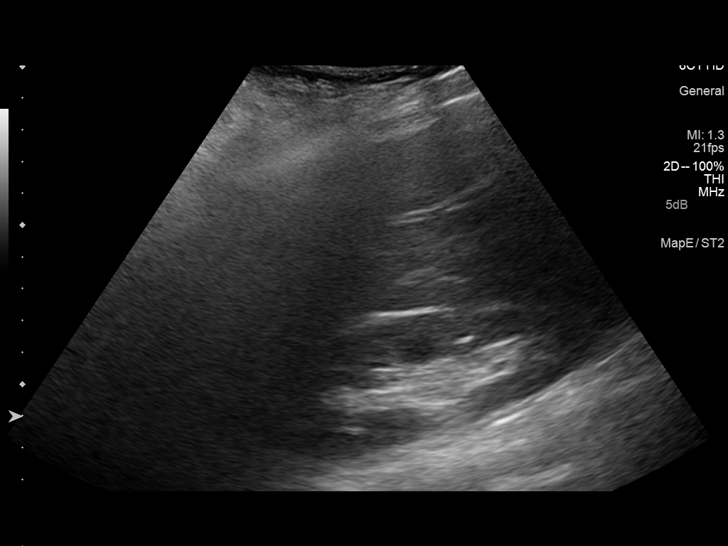
[im 3/26]
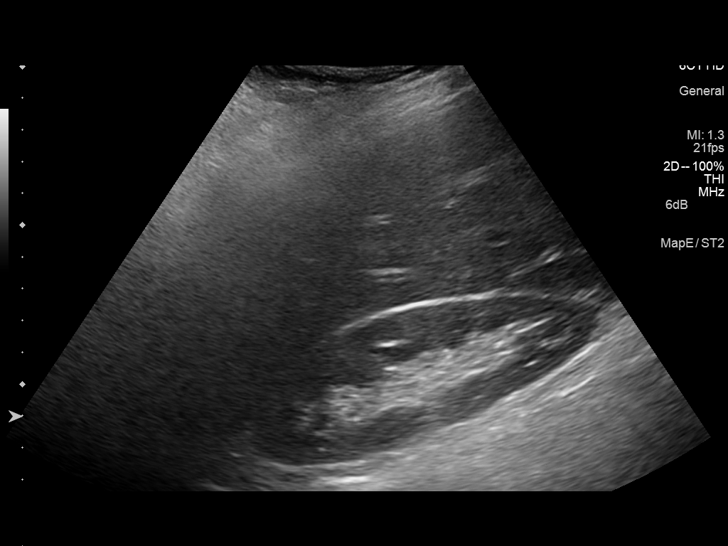
[im 5/26]
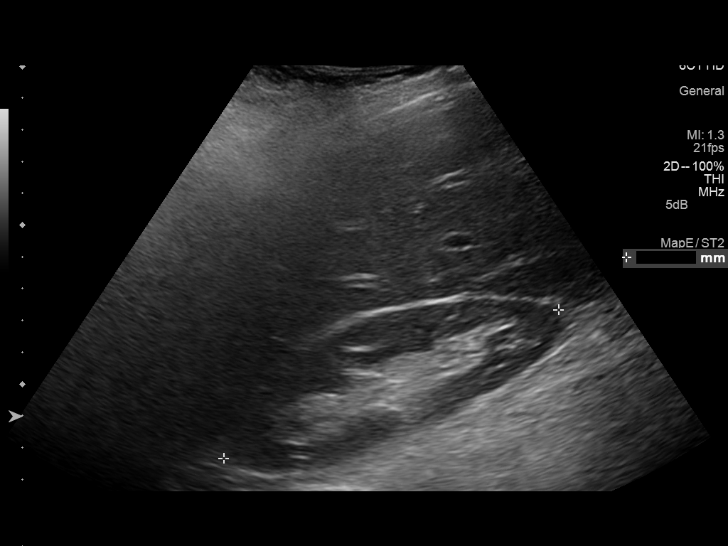
[im 7/26]
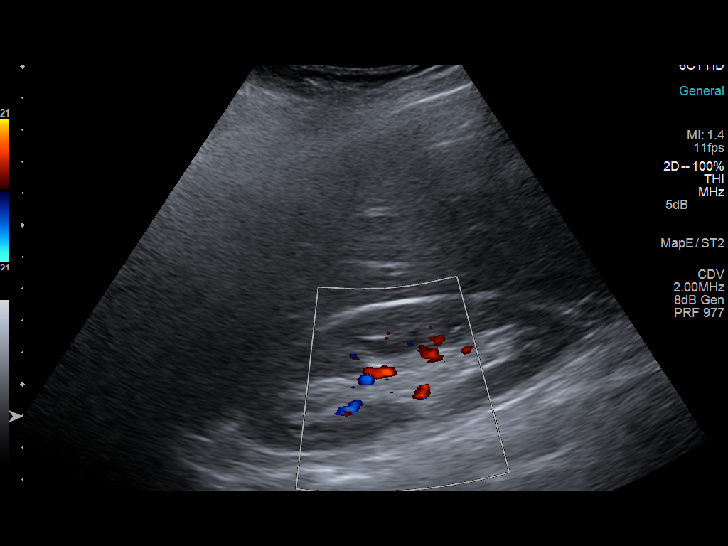
[im 9/26]
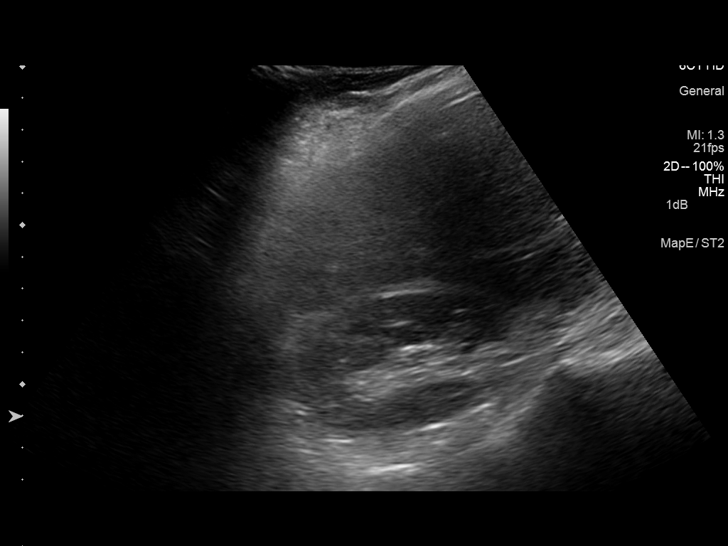
[im 10/26]
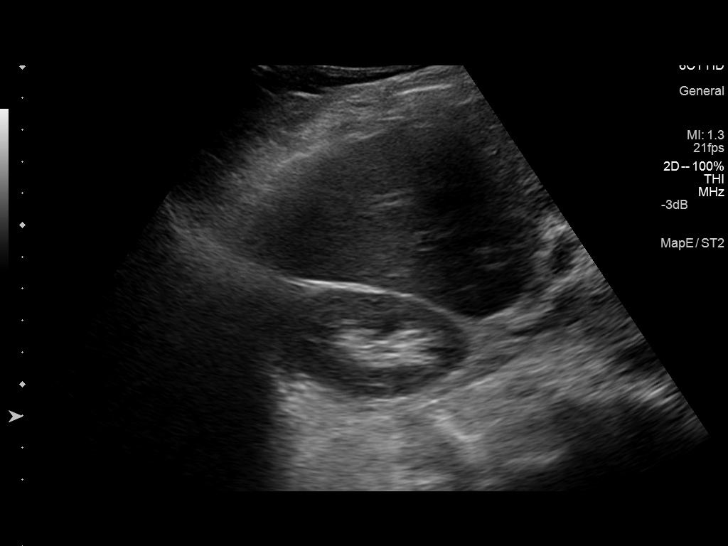
[im 12/26]
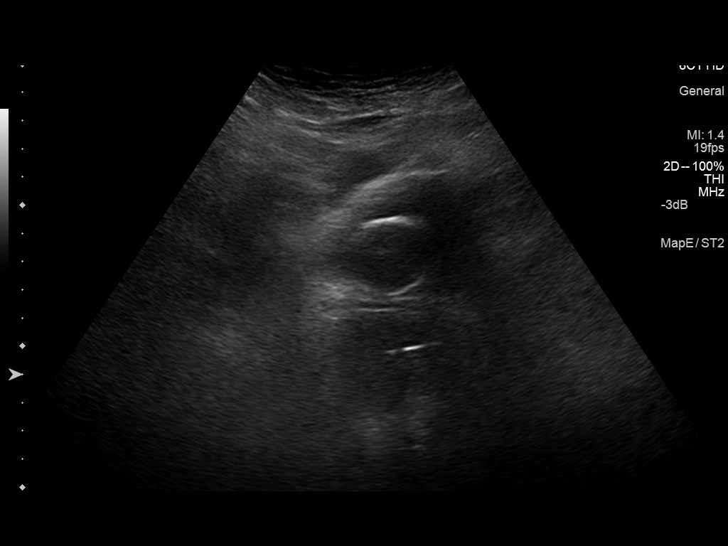
[im 14/26]
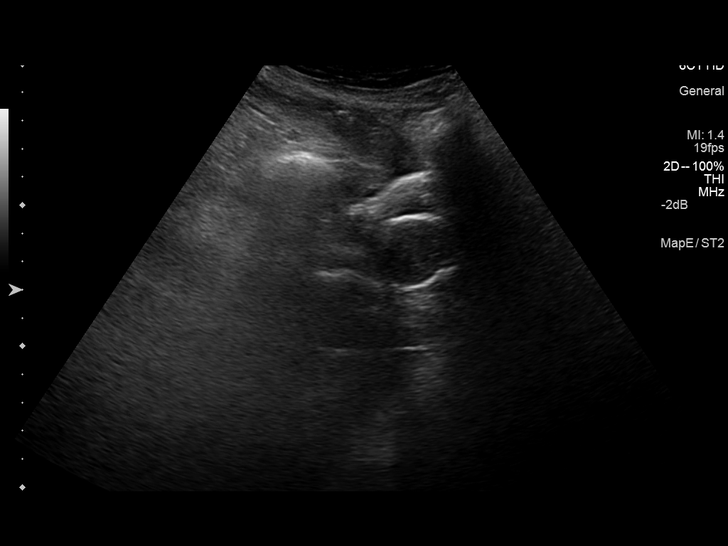
[im 16/26]
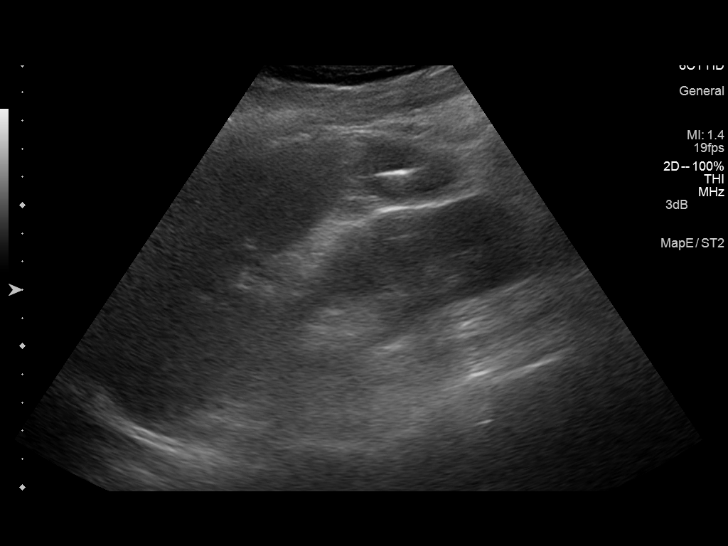
[im 17/26]
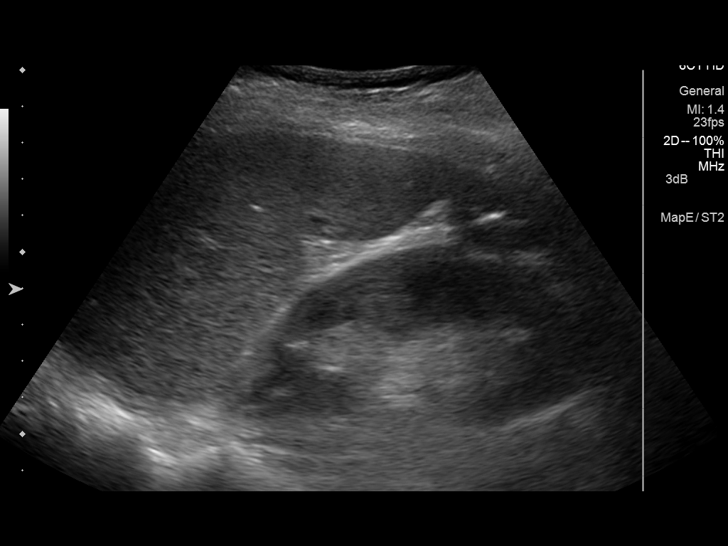
[im 19/26]
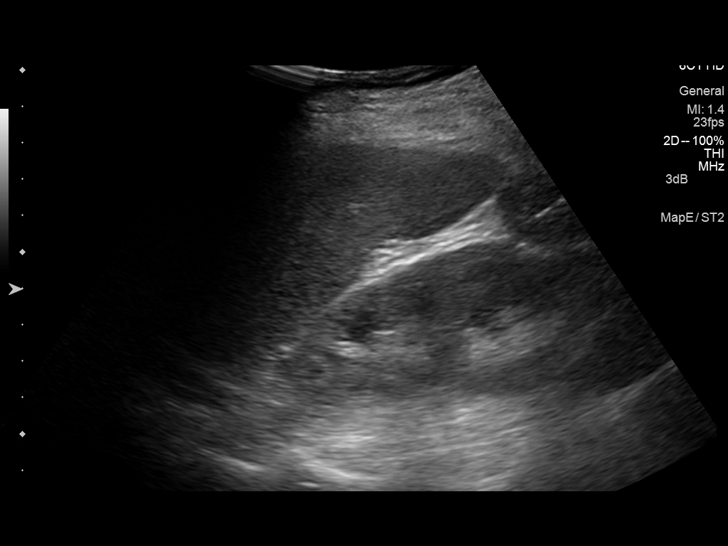
[im 21/26]
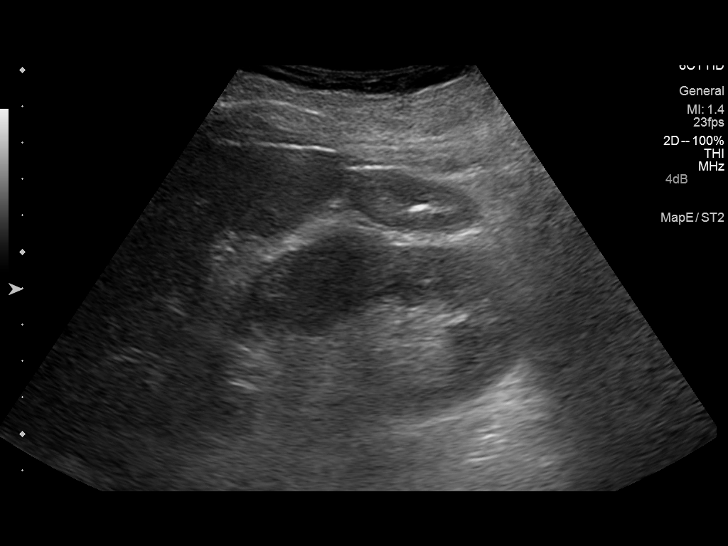
[im 23/26]
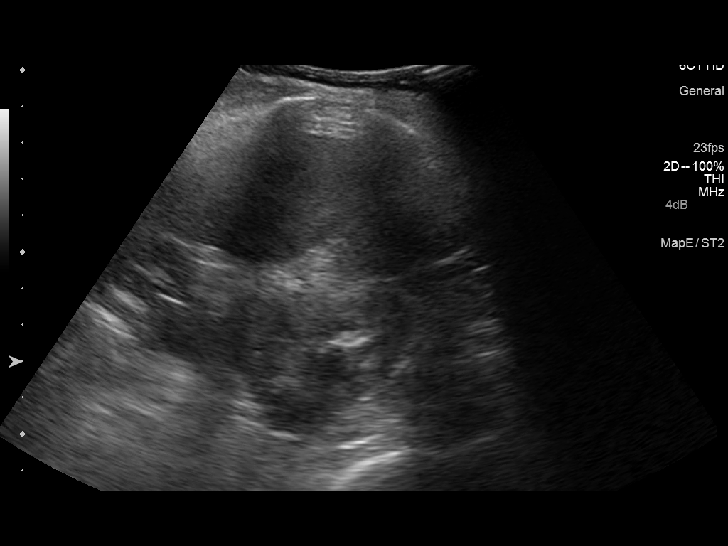
[im 26/26]
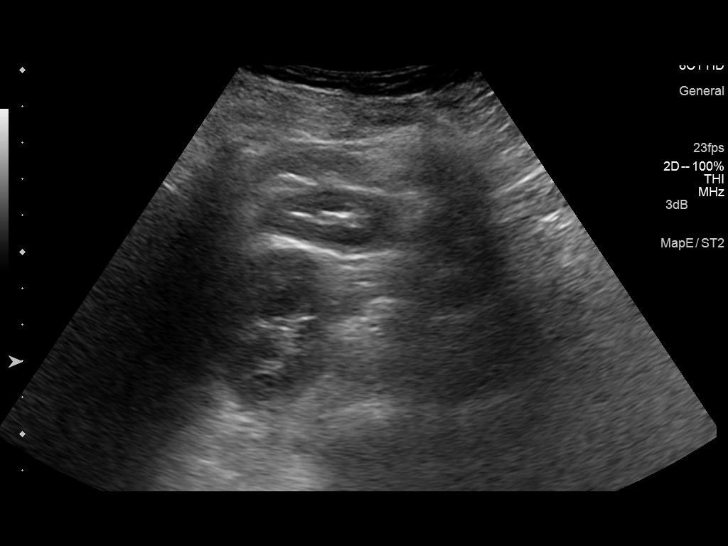

[14 of 25 positions shown; findings below may reference images not displayed]

FINDINGS: Right Kidney:

Length: 11.5 cm. Echogenicity within normal limits. No mass or
hydronephrosis visualized.

Left Kidney:

Length: 11 cm. Echogenicity within normal limits. No mass or
hydronephrosis visualized.

Bladder:

A Foley catheter is in place and the bladder is decompressed.
IMPRESSION: No hydronephrosis or focal renal parenchymal abnormality.

## 2015-09-16 IMAGING — CR DG LUMBAR SPINE 2-3V
2 series · 2 of 2 positions shown · non-contrast
Comparison: 11/20/2013 chest CT

CLINICAL DATA: Back injury.  Urinary retention.

EXAM:
LUMBAR SPINE - 2-3 VIEW

[t lumbar spine ap]
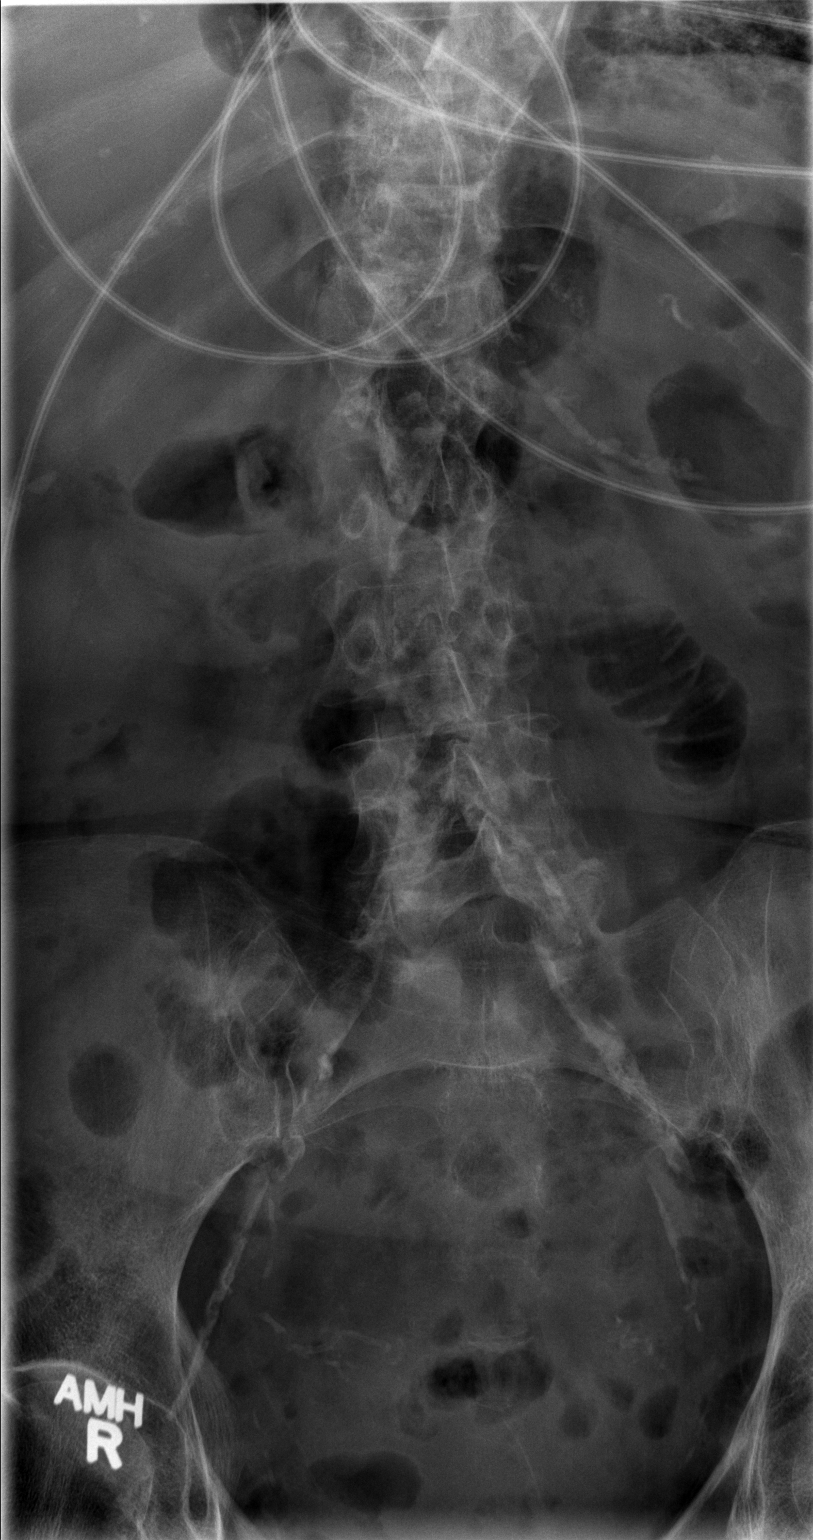

[t lumbar spine lat]
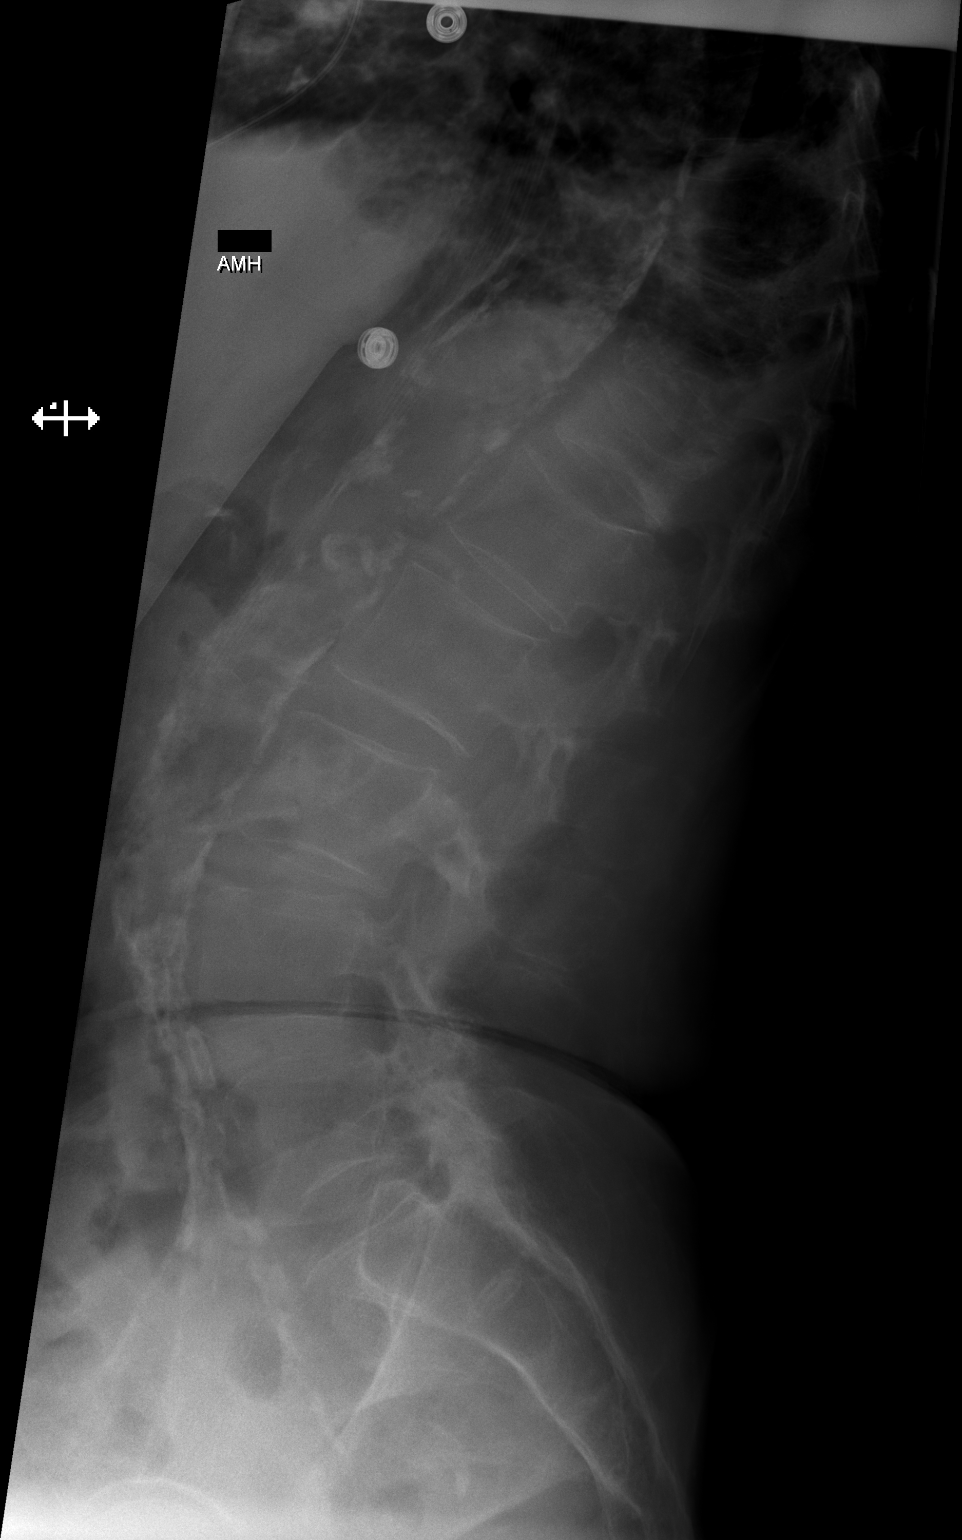

[2 of 2 positions shown; findings below may reference images not displayed]

FINDINGS: Aortoiliac atherosclerotic vascular disease. There is 9 degrees of
dextroconvex lumbar scoliosis measured between T11 and L4. Bony
demineralization if that diffuse bony demineralization.

Facet arthropathy noted at L4-5 at L5-S1 with 6 mm anterolisthesis
at L4-5 and 4 mm retrolisthesis at L3-4. Remote compression
fractures at T12, T11, T9, and T10, similar to prior. Blunted
bilateral costophrenic angle suggesting pleural effusions with faint
opacities in both lung bases on the lateral projection.

Subtle 10% superior endplate compression fracture of L1, new
compared to the prior exam.
IMPRESSION: 1. 10% superior endplate compression fracture at L1, new compared to
the prior exam. No definite posterior bony retropulsion.
2. Subluxations at L3-4 and L4-5 are probably degenerative given the
degree of associated facet arthropathy, and are less likely to be
acute.
3. Osteoporosis.
4. Old compression fractures at T9, T10, T11, and T12.
5. Atherosclerosis.
6. Dextroconvex lumbar scoliosis.

## 2015-12-28 NOTE — Progress Notes (Signed)
This encounter was created in error - please disregard.
